# Patient Record
Sex: Male | Born: 1965 | Race: White | Hispanic: No | Marital: Single | Smoking: Current every day smoker
Health system: Southern US, Community
[De-identification: ages and names within clinical notes are randomized; demographics above are authoritative.]

## PROBLEM LIST (undated history)

## (undated) DIAGNOSIS — N170 Acute kidney failure with tubular necrosis: Secondary | ICD-10-CM

## (undated) DIAGNOSIS — G319 Degenerative disease of nervous system, unspecified: Secondary | ICD-10-CM

## (undated) DIAGNOSIS — M6282 Rhabdomyolysis: Secondary | ICD-10-CM

## (undated) DIAGNOSIS — J449 Chronic obstructive pulmonary disease, unspecified: Secondary | ICD-10-CM

## (undated) DIAGNOSIS — E785 Hyperlipidemia, unspecified: Secondary | ICD-10-CM

## (undated) DIAGNOSIS — J189 Pneumonia, unspecified organism: Secondary | ICD-10-CM

## (undated) DIAGNOSIS — I639 Cerebral infarction, unspecified: Secondary | ICD-10-CM

## (undated) DIAGNOSIS — J690 Pneumonitis due to inhalation of food and vomit: Secondary | ICD-10-CM

## (undated) DIAGNOSIS — A419 Sepsis, unspecified organism: Secondary | ICD-10-CM

## (undated) DIAGNOSIS — J9621 Acute and chronic respiratory failure with hypoxia: Secondary | ICD-10-CM

## (undated) DIAGNOSIS — R6520 Severe sepsis without septic shock: Secondary | ICD-10-CM

## (undated) DIAGNOSIS — F1910 Other psychoactive substance abuse, uncomplicated: Secondary | ICD-10-CM

## (undated) DIAGNOSIS — R569 Unspecified convulsions: Secondary | ICD-10-CM

## (undated) DIAGNOSIS — J45909 Unspecified asthma, uncomplicated: Secondary | ICD-10-CM

## (undated) HISTORY — DX: Unspecified asthma, uncomplicated: J45.909

## (undated) HISTORY — DX: Chronic obstructive pulmonary disease, unspecified: J44.9

## (undated) HISTORY — PX: CERVICAL FUSION: SHX112

## (undated) HISTORY — DX: Hyperlipidemia, unspecified: E78.5

## (undated) HISTORY — DX: Unspecified convulsions: R56.9

## (undated) HISTORY — DX: Cerebral infarction, unspecified: I63.9

## (undated) HISTORY — DX: Degenerative disease of nervous system, unspecified: G31.9

---

## 2013-05-08 HISTORY — PX: CERVICAL FUSION: SHX112

## 2014-03-02 DIAGNOSIS — D62 Acute posthemorrhagic anemia: Secondary | ICD-10-CM | POA: Insufficient documentation

## 2014-03-02 DIAGNOSIS — D696 Thrombocytopenia, unspecified: Secondary | ICD-10-CM | POA: Insufficient documentation

## 2014-03-02 DIAGNOSIS — J939 Pneumothorax, unspecified: Secondary | ICD-10-CM | POA: Insufficient documentation

## 2014-03-02 DIAGNOSIS — S129XXA Fracture of neck, unspecified, initial encounter: Secondary | ICD-10-CM | POA: Insufficient documentation

## 2014-03-02 DIAGNOSIS — E871 Hypo-osmolality and hyponatremia: Secondary | ICD-10-CM | POA: Insufficient documentation

## 2014-03-02 DIAGNOSIS — F10929 Alcohol use, unspecified with intoxication, unspecified: Secondary | ICD-10-CM | POA: Insufficient documentation

## 2014-03-02 DIAGNOSIS — F17200 Nicotine dependence, unspecified, uncomplicated: Secondary | ICD-10-CM | POA: Insufficient documentation

## 2014-03-02 DIAGNOSIS — S13171A Dislocation of C6/C7 cervical vertebrae, initial encounter: Secondary | ICD-10-CM | POA: Insufficient documentation

## 2014-03-02 DIAGNOSIS — D72829 Elevated white blood cell count, unspecified: Secondary | ICD-10-CM | POA: Insufficient documentation

## 2014-03-20 DIAGNOSIS — M792 Neuralgia and neuritis, unspecified: Secondary | ICD-10-CM | POA: Insufficient documentation

## 2014-04-24 DIAGNOSIS — Z981 Arthrodesis status: Secondary | ICD-10-CM | POA: Insufficient documentation

## 2016-12-11 DIAGNOSIS — S92015A Nondisplaced fracture of body of left calcaneus, initial encounter for closed fracture: Secondary | ICD-10-CM | POA: Diagnosis not present

## 2016-12-25 DIAGNOSIS — S92015A Nondisplaced fracture of body of left calcaneus, initial encounter for closed fracture: Secondary | ICD-10-CM | POA: Diagnosis not present

## 2017-01-15 DIAGNOSIS — S92015D Nondisplaced fracture of body of left calcaneus, subsequent encounter for fracture with routine healing: Secondary | ICD-10-CM | POA: Diagnosis not present

## 2017-02-26 DIAGNOSIS — S92015D Nondisplaced fracture of body of left calcaneus, subsequent encounter for fracture with routine healing: Secondary | ICD-10-CM | POA: Diagnosis not present

## 2017-03-26 DIAGNOSIS — S92015D Nondisplaced fracture of body of left calcaneus, subsequent encounter for fracture with routine healing: Secondary | ICD-10-CM | POA: Diagnosis not present

## 2017-05-08 DIAGNOSIS — I639 Cerebral infarction, unspecified: Secondary | ICD-10-CM

## 2017-05-08 HISTORY — DX: Cerebral infarction, unspecified: I63.9

## 2018-07-07 DIAGNOSIS — R918 Other nonspecific abnormal finding of lung field: Secondary | ICD-10-CM | POA: Diagnosis not present

## 2018-07-07 DIAGNOSIS — R4020 Unspecified coma: Secondary | ICD-10-CM | POA: Diagnosis not present

## 2018-07-07 DIAGNOSIS — R0902 Hypoxemia: Secondary | ICD-10-CM | POA: Diagnosis not present

## 2018-07-07 DIAGNOSIS — R911 Solitary pulmonary nodule: Secondary | ICD-10-CM | POA: Diagnosis not present

## 2018-07-07 DIAGNOSIS — R404 Transient alteration of awareness: Secondary | ICD-10-CM | POA: Diagnosis not present

## 2018-07-07 DIAGNOSIS — R079 Chest pain, unspecified: Secondary | ICD-10-CM | POA: Diagnosis not present

## 2018-07-07 DIAGNOSIS — R0600 Dyspnea, unspecified: Secondary | ICD-10-CM | POA: Diagnosis not present

## 2018-07-07 DIAGNOSIS — T68XXXA Hypothermia, initial encounter: Secondary | ICD-10-CM

## 2018-07-07 DIAGNOSIS — A419 Sepsis, unspecified organism: Secondary | ICD-10-CM

## 2018-07-07 DIAGNOSIS — R6521 Severe sepsis with septic shock: Secondary | ICD-10-CM | POA: Diagnosis not present

## 2018-07-07 DIAGNOSIS — R4182 Altered mental status, unspecified: Secondary | ICD-10-CM

## 2018-07-07 DIAGNOSIS — J189 Pneumonia, unspecified organism: Secondary | ICD-10-CM

## 2018-07-07 DIAGNOSIS — K760 Fatty (change of) liver, not elsewhere classified: Secondary | ICD-10-CM | POA: Diagnosis not present

## 2018-07-07 DIAGNOSIS — N179 Acute kidney failure, unspecified: Secondary | ICD-10-CM

## 2018-07-07 DIAGNOSIS — Z452 Encounter for adjustment and management of vascular access device: Secondary | ICD-10-CM | POA: Diagnosis not present

## 2018-07-07 DIAGNOSIS — F1010 Alcohol abuse, uncomplicated: Secondary | ICD-10-CM

## 2018-07-07 DIAGNOSIS — R0689 Other abnormalities of breathing: Secondary | ICD-10-CM | POA: Diagnosis not present

## 2018-07-07 DIAGNOSIS — M6282 Rhabdomyolysis: Secondary | ICD-10-CM

## 2018-07-08 DIAGNOSIS — N19 Unspecified kidney failure: Secondary | ICD-10-CM | POA: Diagnosis not present

## 2018-07-08 DIAGNOSIS — J9600 Acute respiratory failure, unspecified whether with hypoxia or hypercapnia: Secondary | ICD-10-CM | POA: Diagnosis not present

## 2018-07-08 DIAGNOSIS — T68XXXA Hypothermia, initial encounter: Secondary | ICD-10-CM | POA: Diagnosis not present

## 2018-07-08 DIAGNOSIS — R4182 Altered mental status, unspecified: Secondary | ICD-10-CM | POA: Diagnosis not present

## 2018-07-08 DIAGNOSIS — J189 Pneumonia, unspecified organism: Secondary | ICD-10-CM | POA: Diagnosis not present

## 2018-07-08 DIAGNOSIS — N179 Acute kidney failure, unspecified: Secondary | ICD-10-CM | POA: Diagnosis not present

## 2018-07-08 DIAGNOSIS — R935 Abnormal findings on diagnostic imaging of other abdominal regions, including retroperitoneum: Secondary | ICD-10-CM | POA: Diagnosis not present

## 2018-07-09 DIAGNOSIS — R4182 Altered mental status, unspecified: Secondary | ICD-10-CM | POA: Diagnosis not present

## 2018-07-09 DIAGNOSIS — T68XXXA Hypothermia, initial encounter: Secondary | ICD-10-CM | POA: Diagnosis not present

## 2018-07-09 DIAGNOSIS — N179 Acute kidney failure, unspecified: Secondary | ICD-10-CM | POA: Diagnosis not present

## 2018-07-09 DIAGNOSIS — K7031 Alcoholic cirrhosis of liver with ascites: Secondary | ICD-10-CM | POA: Diagnosis not present

## 2018-07-09 DIAGNOSIS — J189 Pneumonia, unspecified organism: Secondary | ICD-10-CM | POA: Diagnosis not present

## 2018-07-09 DIAGNOSIS — J9690 Respiratory failure, unspecified, unspecified whether with hypoxia or hypercapnia: Secondary | ICD-10-CM | POA: Diagnosis not present

## 2018-07-10 DIAGNOSIS — T68XXXA Hypothermia, initial encounter: Secondary | ICD-10-CM | POA: Diagnosis not present

## 2018-07-10 DIAGNOSIS — N179 Acute kidney failure, unspecified: Secondary | ICD-10-CM | POA: Diagnosis not present

## 2018-07-10 DIAGNOSIS — J189 Pneumonia, unspecified organism: Secondary | ICD-10-CM | POA: Diagnosis not present

## 2018-07-10 DIAGNOSIS — R4182 Altered mental status, unspecified: Secondary | ICD-10-CM | POA: Diagnosis not present

## 2018-07-10 DIAGNOSIS — J9690 Respiratory failure, unspecified, unspecified whether with hypoxia or hypercapnia: Secondary | ICD-10-CM | POA: Diagnosis not present

## 2018-07-11 DIAGNOSIS — R4182 Altered mental status, unspecified: Secondary | ICD-10-CM | POA: Diagnosis not present

## 2018-07-11 DIAGNOSIS — J189 Pneumonia, unspecified organism: Secondary | ICD-10-CM | POA: Diagnosis not present

## 2018-07-11 DIAGNOSIS — N179 Acute kidney failure, unspecified: Secondary | ICD-10-CM | POA: Diagnosis not present

## 2018-07-11 DIAGNOSIS — T68XXXA Hypothermia, initial encounter: Secondary | ICD-10-CM | POA: Diagnosis not present

## 2018-07-12 DIAGNOSIS — T68XXXA Hypothermia, initial encounter: Secondary | ICD-10-CM | POA: Diagnosis not present

## 2018-07-12 DIAGNOSIS — J9690 Respiratory failure, unspecified, unspecified whether with hypoxia or hypercapnia: Secondary | ICD-10-CM | POA: Diagnosis not present

## 2018-07-12 DIAGNOSIS — J189 Pneumonia, unspecified organism: Secondary | ICD-10-CM | POA: Diagnosis not present

## 2018-07-12 DIAGNOSIS — N179 Acute kidney failure, unspecified: Secondary | ICD-10-CM | POA: Diagnosis not present

## 2018-07-12 DIAGNOSIS — R4182 Altered mental status, unspecified: Secondary | ICD-10-CM | POA: Diagnosis not present

## 2018-07-13 DIAGNOSIS — J9690 Respiratory failure, unspecified, unspecified whether with hypoxia or hypercapnia: Secondary | ICD-10-CM | POA: Diagnosis not present

## 2018-07-13 DIAGNOSIS — R4182 Altered mental status, unspecified: Secondary | ICD-10-CM | POA: Diagnosis not present

## 2018-07-13 DIAGNOSIS — R443 Hallucinations, unspecified: Secondary | ICD-10-CM | POA: Diagnosis not present

## 2018-07-13 DIAGNOSIS — T68XXXA Hypothermia, initial encounter: Secondary | ICD-10-CM | POA: Diagnosis not present

## 2018-07-13 DIAGNOSIS — J189 Pneumonia, unspecified organism: Secondary | ICD-10-CM | POA: Diagnosis not present

## 2018-07-13 DIAGNOSIS — N179 Acute kidney failure, unspecified: Secondary | ICD-10-CM | POA: Diagnosis not present

## 2018-07-14 DIAGNOSIS — T68XXXA Hypothermia, initial encounter: Secondary | ICD-10-CM | POA: Diagnosis not present

## 2018-07-14 DIAGNOSIS — N179 Acute kidney failure, unspecified: Secondary | ICD-10-CM | POA: Diagnosis not present

## 2018-07-14 DIAGNOSIS — J984 Other disorders of lung: Secondary | ICD-10-CM | POA: Diagnosis not present

## 2018-07-14 DIAGNOSIS — J189 Pneumonia, unspecified organism: Secondary | ICD-10-CM | POA: Diagnosis not present

## 2018-07-14 DIAGNOSIS — R4182 Altered mental status, unspecified: Secondary | ICD-10-CM | POA: Diagnosis not present

## 2018-07-15 DIAGNOSIS — R4182 Altered mental status, unspecified: Secondary | ICD-10-CM | POA: Diagnosis not present

## 2018-07-15 DIAGNOSIS — A4150 Gram-negative sepsis, unspecified: Secondary | ICD-10-CM | POA: Diagnosis not present

## 2018-07-15 DIAGNOSIS — J189 Pneumonia, unspecified organism: Secondary | ICD-10-CM | POA: Diagnosis not present

## 2018-07-15 DIAGNOSIS — J90 Pleural effusion, not elsewhere classified: Secondary | ICD-10-CM | POA: Diagnosis not present

## 2018-07-15 DIAGNOSIS — T68XXXA Hypothermia, initial encounter: Secondary | ICD-10-CM | POA: Diagnosis not present

## 2018-07-15 DIAGNOSIS — N179 Acute kidney failure, unspecified: Secondary | ICD-10-CM | POA: Diagnosis not present

## 2018-07-16 ENCOUNTER — Inpatient Hospital Stay
Admission: RE | Admit: 2018-07-16 | Discharge: 2018-08-20 | Disposition: A | Discharge status: Skilled Nursing Facility | Payer: Medicare Other | Source: Other Acute Inpatient Hospital | Attending: Internal Medicine | Admitting: Internal Medicine

## 2018-07-16 DIAGNOSIS — A419 Sepsis, unspecified organism: Secondary | ICD-10-CM | POA: Diagnosis present

## 2018-07-16 DIAGNOSIS — R0902 Hypoxemia: Secondary | ICD-10-CM

## 2018-07-16 DIAGNOSIS — M6282 Rhabdomyolysis: Secondary | ICD-10-CM | POA: Diagnosis present

## 2018-07-16 DIAGNOSIS — N170 Acute kidney failure with tubular necrosis: Secondary | ICD-10-CM | POA: Diagnosis present

## 2018-07-16 DIAGNOSIS — R6520 Severe sepsis without septic shock: Secondary | ICD-10-CM

## 2018-07-16 DIAGNOSIS — J189 Pneumonia, unspecified organism: Secondary | ICD-10-CM | POA: Diagnosis not present

## 2018-07-16 DIAGNOSIS — J9621 Acute and chronic respiratory failure with hypoxia: Secondary | ICD-10-CM | POA: Diagnosis present

## 2018-07-16 DIAGNOSIS — J90 Pleural effusion, not elsewhere classified: Secondary | ICD-10-CM | POA: Diagnosis not present

## 2018-07-16 DIAGNOSIS — R5381 Other malaise: Secondary | ICD-10-CM | POA: Diagnosis not present

## 2018-07-16 DIAGNOSIS — R4182 Altered mental status, unspecified: Secondary | ICD-10-CM | POA: Diagnosis not present

## 2018-07-16 DIAGNOSIS — J811 Chronic pulmonary edema: Secondary | ICD-10-CM

## 2018-07-16 DIAGNOSIS — J9690 Respiratory failure, unspecified, unspecified whether with hypoxia or hypercapnia: Secondary | ICD-10-CM

## 2018-07-16 DIAGNOSIS — Z4659 Encounter for fitting and adjustment of other gastrointestinal appliance and device: Secondary | ICD-10-CM

## 2018-07-16 DIAGNOSIS — Z743 Need for continuous supervision: Secondary | ICD-10-CM | POA: Diagnosis not present

## 2018-07-16 DIAGNOSIS — N179 Acute kidney failure, unspecified: Secondary | ICD-10-CM | POA: Diagnosis not present

## 2018-07-16 DIAGNOSIS — Z431 Encounter for attention to gastrostomy: Secondary | ICD-10-CM

## 2018-07-16 DIAGNOSIS — Z789 Other specified health status: Secondary | ICD-10-CM

## 2018-07-16 DIAGNOSIS — R0602 Shortness of breath: Secondary | ICD-10-CM

## 2018-07-16 DIAGNOSIS — F1910 Other psychoactive substance abuse, uncomplicated: Secondary | ICD-10-CM | POA: Diagnosis present

## 2018-07-16 DIAGNOSIS — J690 Pneumonitis due to inhalation of food and vomit: Secondary | ICD-10-CM | POA: Diagnosis present

## 2018-07-16 DIAGNOSIS — T68XXXA Hypothermia, initial encounter: Secondary | ICD-10-CM | POA: Diagnosis not present

## 2018-07-16 HISTORY — DX: Acute kidney failure with tubular necrosis: N17.0

## 2018-07-16 HISTORY — DX: Severe sepsis without septic shock: A41.9

## 2018-07-16 HISTORY — DX: Pneumonia, unspecified organism: J18.9

## 2018-07-16 HISTORY — DX: Pneumonitis due to inhalation of food and vomit: J69.0

## 2018-07-16 HISTORY — DX: Severe sepsis without septic shock: R65.20

## 2018-07-16 HISTORY — DX: Acute and chronic respiratory failure with hypoxia: J96.21

## 2018-07-16 HISTORY — DX: Rhabdomyolysis: M62.82

## 2018-07-16 HISTORY — DX: Other psychoactive substance abuse, uncomplicated: F19.10

## 2018-07-16 LAB — PROTIME-INR
INR: 1.3 — ABNORMAL HIGH (ref 0.8–1.2)
Prothrombin Time: 15.6 seconds — ABNORMAL HIGH (ref 11.4–15.2)

## 2018-07-17 ENCOUNTER — Other Ambulatory Visit (HOSPITAL_BASED_OUTPATIENT_CLINIC_OR_DEPARTMENT_OTHER): Payer: Medicare Other

## 2018-07-17 ENCOUNTER — Other Ambulatory Visit (HOSPITAL_COMMUNITY): Payer: Medicare Other

## 2018-07-17 DIAGNOSIS — J9690 Respiratory failure, unspecified, unspecified whether with hypoxia or hypercapnia: Secondary | ICD-10-CM | POA: Diagnosis not present

## 2018-07-17 DIAGNOSIS — J9601 Acute respiratory failure with hypoxia: Secondary | ICD-10-CM | POA: Diagnosis not present

## 2018-07-17 DIAGNOSIS — N179 Acute kidney failure, unspecified: Secondary | ICD-10-CM | POA: Diagnosis not present

## 2018-07-17 DIAGNOSIS — R0602 Shortness of breath: Secondary | ICD-10-CM | POA: Diagnosis not present

## 2018-07-17 DIAGNOSIS — J690 Pneumonitis due to inhalation of food and vomit: Secondary | ICD-10-CM | POA: Diagnosis not present

## 2018-07-17 DIAGNOSIS — R4182 Altered mental status, unspecified: Secondary | ICD-10-CM | POA: Diagnosis not present

## 2018-07-17 LAB — COMPREHENSIVE METABOLIC PANEL
ALT: 28 U/L (ref 0–44)
AST: 30 U/L (ref 15–41)
Albumin: 2.4 g/dL — ABNORMAL LOW (ref 3.5–5.0)
Alkaline Phosphatase: 128 U/L — ABNORMAL HIGH (ref 38–126)
Anion gap: 8 (ref 5–15)
BILIRUBIN TOTAL: 0.9 mg/dL (ref 0.3–1.2)
BUN: 7 mg/dL (ref 6–20)
CO2: 31 mmol/L (ref 22–32)
Calcium: 8.2 mg/dL — ABNORMAL LOW (ref 8.9–10.3)
Chloride: 102 mmol/L (ref 98–111)
Creatinine, Ser: 0.94 mg/dL (ref 0.61–1.24)
GFR calc non Af Amer: >60 mL/min (ref >60)
Glucose, Bld: 103 mg/dL — ABNORMAL HIGH (ref 70–99)
Potassium: 3 mmol/L — ABNORMAL LOW (ref 3.5–5.1)
Sodium: 141 mmol/L (ref 135–145)
TOTAL PROTEIN: 5.3 g/dL — AB (ref 6.5–8.1)

## 2018-07-17 LAB — BLOOD GAS, ARTERIAL
Acid-Base Excess: 8 mmol/L — ABNORMAL HIGH (ref 0.0–2.0)
Bicarbonate: 32.8 mmol/L — ABNORMAL HIGH (ref 20.0–28.0)
DELIVERY SYSTEMS: POSITIVE
Expiratory PAP: 6
FIO2: 40
Inspiratory PAP: 12
O2 Saturation: 98.7 %
Patient temperature: 98.9
pCO2 arterial: 53.6 mmHg — ABNORMAL HIGH (ref 32.0–48.0)
pH, Arterial: 7.404 (ref 7.350–7.450)
pO2, Arterial: 134 mmHg — ABNORMAL HIGH (ref 83.0–108.0)

## 2018-07-17 LAB — MAGNESIUM: Magnesium: 1.4 mg/dL — ABNORMAL LOW (ref 1.7–2.4)

## 2018-07-17 LAB — CBC
HCT: 28.9 % — ABNORMAL LOW (ref 39.0–52.0)
Hemoglobin: 9.3 g/dL — ABNORMAL LOW (ref 13.0–17.0)
MCH: 34.4 pg — ABNORMAL HIGH (ref 26.0–34.0)
MCHC: 32.2 g/dL (ref 30.0–36.0)
MCV: 107 fL — ABNORMAL HIGH (ref 80.0–100.0)
Platelets: 221 10*3/uL (ref 150–400)
RBC: 2.7 MIL/uL — ABNORMAL LOW (ref 4.22–5.81)
RDW: 15.2 % (ref 11.5–15.5)
WBC: 13.5 10*3/uL — ABNORMAL HIGH (ref 4.0–10.5)
nRBC: 0 % (ref 0.0–0.2)

## 2018-07-17 LAB — VANCOMYCIN, TROUGH: Vancomycin Tr: 15 ug/mL (ref 15–20)

## 2018-07-17 LAB — CK TOTAL AND CKMB (NOT AT ARMC)
CK, MB: 8.8 ng/mL — ABNORMAL HIGH (ref 0.5–5.0)
Relative Index: INVALID (ref 0.0–2.5)
Total CK: 80 U/L (ref 49–397)

## 2018-07-17 NOTE — Progress Notes (Signed)
  Echocardiogram 2D Echocardiogram has been performed.  Celene Skeen 07/17/2018, 2:54 PM

## 2018-07-18 DIAGNOSIS — J690 Pneumonitis due to inhalation of food and vomit: Secondary | ICD-10-CM | POA: Diagnosis not present

## 2018-07-18 DIAGNOSIS — R4182 Altered mental status, unspecified: Secondary | ICD-10-CM | POA: Diagnosis not present

## 2018-07-18 DIAGNOSIS — J9601 Acute respiratory failure with hypoxia: Secondary | ICD-10-CM | POA: Diagnosis not present

## 2018-07-18 DIAGNOSIS — N179 Acute kidney failure, unspecified: Secondary | ICD-10-CM | POA: Diagnosis not present

## 2018-07-18 LAB — COMPREHENSIVE METABOLIC PANEL
ALT: 23 U/L (ref 0–44)
ANION GAP: 8 (ref 5–15)
AST: 30 U/L (ref 15–41)
Albumin: 2 g/dL — ABNORMAL LOW (ref 3.5–5.0)
Alkaline Phosphatase: 106 U/L (ref 38–126)
BILIRUBIN TOTAL: 0.5 mg/dL (ref 0.3–1.2)
BUN: 11 mg/dL (ref 6–20)
CO2: 30 mmol/L (ref 22–32)
Calcium: 7.9 mg/dL — ABNORMAL LOW (ref 8.9–10.3)
Chloride: 102 mmol/L (ref 98–111)
Creatinine, Ser: 0.86 mg/dL (ref 0.61–1.24)
GFR calc Af Amer: >60 mL/min (ref >60)
GFR calc non Af Amer: >60 mL/min (ref >60)
Glucose, Bld: 144 mg/dL — ABNORMAL HIGH (ref 70–99)
Potassium: 2.6 mmol/L — CL (ref 3.5–5.1)
Sodium: 140 mmol/L (ref 135–145)
Total Protein: 5 g/dL — ABNORMAL LOW (ref 6.5–8.1)

## 2018-07-18 LAB — TRIGLYCERIDES: Triglycerides: 93 mg/dL (ref <150)

## 2018-07-18 LAB — TSH: TSH: 9.677 u[IU]/mL — ABNORMAL HIGH (ref 0.350–4.500)

## 2018-07-18 LAB — PHOSPHORUS: Phosphorus: 3.1 mg/dL (ref 2.5–4.6)

## 2018-07-18 LAB — VANCOMYCIN, TROUGH: Vancomycin Tr: 18 ug/mL (ref 15–20)

## 2018-07-18 LAB — MAGNESIUM: Magnesium: 1.5 mg/dL — ABNORMAL LOW (ref 1.7–2.4)

## 2018-07-18 LAB — T4, FREE: Free T4: 0.77 ng/dL — ABNORMAL LOW (ref 0.82–1.77)

## 2018-07-19 ENCOUNTER — Other Ambulatory Visit (HOSPITAL_COMMUNITY): Payer: Medicare Other

## 2018-07-19 DIAGNOSIS — J690 Pneumonitis due to inhalation of food and vomit: Secondary | ICD-10-CM | POA: Diagnosis not present

## 2018-07-19 DIAGNOSIS — J9601 Acute respiratory failure with hypoxia: Secondary | ICD-10-CM | POA: Diagnosis not present

## 2018-07-19 DIAGNOSIS — I509 Heart failure, unspecified: Secondary | ICD-10-CM | POA: Diagnosis not present

## 2018-07-19 DIAGNOSIS — E039 Hypothyroidism, unspecified: Secondary | ICD-10-CM | POA: Diagnosis not present

## 2018-07-19 DIAGNOSIS — R4182 Altered mental status, unspecified: Secondary | ICD-10-CM | POA: Diagnosis not present

## 2018-07-19 LAB — BASIC METABOLIC PANEL
Anion gap: 8 (ref 5–15)
BUN: 14 mg/dL (ref 6–20)
CO2: 27 mmol/L (ref 22–32)
Calcium: 7.9 mg/dL — ABNORMAL LOW (ref 8.9–10.3)
Chloride: 102 mmol/L (ref 98–111)
Creatinine, Ser: 0.71 mg/dL (ref 0.61–1.24)
GFR calc Af Amer: >60 mL/min (ref >60)
GFR calc non Af Amer: >60 mL/min (ref >60)
Glucose, Bld: 116 mg/dL — ABNORMAL HIGH (ref 70–99)
Potassium: 3.3 mmol/L — ABNORMAL LOW (ref 3.5–5.1)
Sodium: 137 mmol/L (ref 135–145)

## 2018-07-19 LAB — VANCOMYCIN, TROUGH
Vancomycin Tr: 25 ug/mL (ref 15–20)
Vancomycin Tr: 12 ug/mL — ABNORMAL LOW (ref 15–20)

## 2018-07-20 DIAGNOSIS — R4182 Altered mental status, unspecified: Secondary | ICD-10-CM | POA: Diagnosis not present

## 2018-07-20 DIAGNOSIS — E039 Hypothyroidism, unspecified: Secondary | ICD-10-CM | POA: Diagnosis not present

## 2018-07-20 DIAGNOSIS — J9601 Acute respiratory failure with hypoxia: Secondary | ICD-10-CM | POA: Diagnosis not present

## 2018-07-20 DIAGNOSIS — J690 Pneumonitis due to inhalation of food and vomit: Secondary | ICD-10-CM | POA: Diagnosis not present

## 2018-07-20 LAB — VANCOMYCIN, TROUGH: Vancomycin Tr: 22 ug/mL (ref 15–20)

## 2018-07-21 DIAGNOSIS — R4182 Altered mental status, unspecified: Secondary | ICD-10-CM | POA: Diagnosis not present

## 2018-07-21 DIAGNOSIS — E039 Hypothyroidism, unspecified: Secondary | ICD-10-CM | POA: Diagnosis not present

## 2018-07-21 DIAGNOSIS — J9601 Acute respiratory failure with hypoxia: Secondary | ICD-10-CM | POA: Diagnosis not present

## 2018-07-21 DIAGNOSIS — J690 Pneumonitis due to inhalation of food and vomit: Secondary | ICD-10-CM | POA: Diagnosis not present

## 2018-07-21 LAB — PHOSPHORUS: Phosphorus: 4.7 mg/dL — ABNORMAL HIGH (ref 2.5–4.6)

## 2018-07-21 LAB — BASIC METABOLIC PANEL
Anion gap: 10 (ref 5–15)
BUN: 16 mg/dL (ref 6–20)
CO2: 26 mmol/L (ref 22–32)
Calcium: 8.5 mg/dL — ABNORMAL LOW (ref 8.9–10.3)
Chloride: 99 mmol/L (ref 98–111)
Creatinine, Ser: 0.61 mg/dL (ref 0.61–1.24)
GFR calc non Af Amer: >60 mL/min (ref >60)
Glucose, Bld: 110 mg/dL — ABNORMAL HIGH (ref 70–99)
Potassium: 3.8 mmol/L (ref 3.5–5.1)
Sodium: 135 mmol/L (ref 135–145)

## 2018-07-21 LAB — MAGNESIUM: Magnesium: 1.6 mg/dL — ABNORMAL LOW (ref 1.7–2.4)

## 2018-07-21 LAB — VANCOMYCIN, TROUGH: Vancomycin Tr: 23 ug/mL (ref 15–20)

## 2018-07-21 LAB — TRIGLYCERIDES: Triglycerides: 99 mg/dL (ref <150)

## 2018-07-22 DIAGNOSIS — J9600 Acute respiratory failure, unspecified whether with hypoxia or hypercapnia: Secondary | ICD-10-CM | POA: Diagnosis not present

## 2018-07-22 DIAGNOSIS — E039 Hypothyroidism, unspecified: Secondary | ICD-10-CM | POA: Diagnosis not present

## 2018-07-22 DIAGNOSIS — J690 Pneumonitis due to inhalation of food and vomit: Secondary | ICD-10-CM | POA: Diagnosis not present

## 2018-07-22 DIAGNOSIS — R4182 Altered mental status, unspecified: Secondary | ICD-10-CM | POA: Diagnosis not present

## 2018-07-22 LAB — COMPREHENSIVE METABOLIC PANEL
ALT: 19 U/L (ref 0–44)
ANION GAP: 9 (ref 5–15)
AST: 36 U/L (ref 15–41)
Albumin: 2.1 g/dL — ABNORMAL LOW (ref 3.5–5.0)
Alkaline Phosphatase: 101 U/L (ref 38–126)
BUN: 15 mg/dL (ref 6–20)
CO2: 25 mmol/L (ref 22–32)
Calcium: 8.6 mg/dL — ABNORMAL LOW (ref 8.9–10.3)
Chloride: 99 mmol/L (ref 98–111)
Creatinine, Ser: 0.65 mg/dL (ref 0.61–1.24)
GFR calc Af Amer: >60 mL/min (ref >60)
GFR calc non Af Amer: >60 mL/min (ref >60)
Glucose, Bld: 44 mg/dL — CL (ref 70–99)
Potassium: 4.9 mmol/L (ref 3.5–5.1)
Sodium: 133 mmol/L — ABNORMAL LOW (ref 135–145)
Total Bilirubin: 0.7 mg/dL (ref 0.3–1.2)
Total Protein: 5.7 g/dL — ABNORMAL LOW (ref 6.5–8.1)

## 2018-07-22 LAB — VANCOMYCIN, TROUGH: Vancomycin Tr: 8 ug/mL — ABNORMAL LOW (ref 15–20)

## 2018-07-23 DIAGNOSIS — E162 Hypoglycemia, unspecified: Secondary | ICD-10-CM | POA: Diagnosis not present

## 2018-07-23 DIAGNOSIS — J9600 Acute respiratory failure, unspecified whether with hypoxia or hypercapnia: Secondary | ICD-10-CM | POA: Diagnosis not present

## 2018-07-23 DIAGNOSIS — E039 Hypothyroidism, unspecified: Secondary | ICD-10-CM | POA: Diagnosis not present

## 2018-07-23 DIAGNOSIS — J690 Pneumonitis due to inhalation of food and vomit: Secondary | ICD-10-CM | POA: Diagnosis not present

## 2018-07-23 LAB — VANCOMYCIN, TROUGH: Vancomycin Tr: 16 ug/mL (ref 15–20)

## 2018-07-24 DIAGNOSIS — J9600 Acute respiratory failure, unspecified whether with hypoxia or hypercapnia: Secondary | ICD-10-CM | POA: Diagnosis not present

## 2018-07-24 DIAGNOSIS — J690 Pneumonitis due to inhalation of food and vomit: Secondary | ICD-10-CM | POA: Diagnosis not present

## 2018-07-24 DIAGNOSIS — E162 Hypoglycemia, unspecified: Secondary | ICD-10-CM | POA: Diagnosis not present

## 2018-07-24 DIAGNOSIS — E039 Hypothyroidism, unspecified: Secondary | ICD-10-CM | POA: Diagnosis not present

## 2018-07-24 LAB — TRIGLYCERIDES: Triglycerides: 69 mg/dL (ref <150)

## 2018-07-24 LAB — PHOSPHORUS: Phosphorus: 5.3 mg/dL — ABNORMAL HIGH (ref 2.5–4.6)

## 2018-07-24 LAB — VANCOMYCIN, TROUGH: Vancomycin Tr: 7 ug/mL — ABNORMAL LOW (ref 15–20)

## 2018-07-24 LAB — MAGNESIUM: Magnesium: 1.8 mg/dL (ref 1.7–2.4)

## 2018-07-25 DIAGNOSIS — J690 Pneumonitis due to inhalation of food and vomit: Secondary | ICD-10-CM | POA: Diagnosis not present

## 2018-07-25 DIAGNOSIS — E039 Hypothyroidism, unspecified: Secondary | ICD-10-CM | POA: Diagnosis not present

## 2018-07-25 DIAGNOSIS — E162 Hypoglycemia, unspecified: Secondary | ICD-10-CM | POA: Diagnosis not present

## 2018-07-25 DIAGNOSIS — J9600 Acute respiratory failure, unspecified whether with hypoxia or hypercapnia: Secondary | ICD-10-CM | POA: Diagnosis not present

## 2018-07-25 LAB — VANCOMYCIN, TROUGH

## 2018-07-26 DIAGNOSIS — R4182 Altered mental status, unspecified: Secondary | ICD-10-CM | POA: Diagnosis not present

## 2018-07-26 DIAGNOSIS — E039 Hypothyroidism, unspecified: Secondary | ICD-10-CM | POA: Diagnosis not present

## 2018-07-26 DIAGNOSIS — J9600 Acute respiratory failure, unspecified whether with hypoxia or hypercapnia: Secondary | ICD-10-CM | POA: Diagnosis not present

## 2018-07-26 DIAGNOSIS — J690 Pneumonitis due to inhalation of food and vomit: Secondary | ICD-10-CM | POA: Diagnosis not present

## 2018-07-26 LAB — VANCOMYCIN, TROUGH: Vancomycin Tr: <4 ug/mL — ABNORMAL LOW (ref 15–20)

## 2018-07-27 ENCOUNTER — Other Ambulatory Visit (HOSPITAL_COMMUNITY): Payer: Medicare Other

## 2018-07-27 DIAGNOSIS — J690 Pneumonitis due to inhalation of food and vomit: Secondary | ICD-10-CM | POA: Diagnosis not present

## 2018-07-27 DIAGNOSIS — R4182 Altered mental status, unspecified: Secondary | ICD-10-CM | POA: Diagnosis not present

## 2018-07-27 DIAGNOSIS — J9600 Acute respiratory failure, unspecified whether with hypoxia or hypercapnia: Secondary | ICD-10-CM | POA: Diagnosis not present

## 2018-07-27 DIAGNOSIS — E039 Hypothyroidism, unspecified: Secondary | ICD-10-CM | POA: Diagnosis not present

## 2018-07-27 DIAGNOSIS — R0602 Shortness of breath: Secondary | ICD-10-CM | POA: Diagnosis not present

## 2018-07-27 LAB — COMPREHENSIVE METABOLIC PANEL
ALT: 19 U/L (ref 0–44)
AST: 30 U/L (ref 15–41)
Albumin: 2.4 g/dL — ABNORMAL LOW (ref 3.5–5.0)
Alkaline Phosphatase: 99 U/L (ref 38–126)
Anion gap: 9 (ref 5–15)
BUN: 13 mg/dL (ref 6–20)
CO2: 25 mmol/L (ref 22–32)
Calcium: 9 mg/dL (ref 8.9–10.3)
Chloride: 95 mmol/L — ABNORMAL LOW (ref 98–111)
Creatinine, Ser: 0.5 mg/dL — ABNORMAL LOW (ref 0.61–1.24)
GFR calc Af Amer: >60 mL/min (ref >60)
GFR calc non Af Amer: >60 mL/min (ref >60)
GLUCOSE: 103 mg/dL — AB (ref 70–99)
POTASSIUM: 3.7 mmol/L (ref 3.5–5.1)
Sodium: 129 mmol/L — ABNORMAL LOW (ref 135–145)
TOTAL PROTEIN: 6 g/dL — AB (ref 6.5–8.1)
Total Bilirubin: 0.3 mg/dL (ref 0.3–1.2)

## 2018-07-27 LAB — CBC
HEMATOCRIT: 26.1 % — AB (ref 39.0–52.0)
Hemoglobin: 8.8 g/dL — ABNORMAL LOW (ref 13.0–17.0)
MCH: 34.2 pg — ABNORMAL HIGH (ref 26.0–34.0)
MCHC: 33.7 g/dL (ref 30.0–36.0)
MCV: 101.6 fL — ABNORMAL HIGH (ref 80.0–100.0)
Platelets: 179 10*3/uL (ref 150–400)
RBC: 2.57 MIL/uL — ABNORMAL LOW (ref 4.22–5.81)
RDW: 13 % (ref 11.5–15.5)
WBC: 4.8 10*3/uL (ref 4.0–10.5)
nRBC: 0 % (ref 0.0–0.2)

## 2018-07-27 LAB — PHOSPHORUS: Phosphorus: 5.9 mg/dL — ABNORMAL HIGH (ref 2.5–4.6)

## 2018-07-27 LAB — VANCOMYCIN, TROUGH: Vancomycin Tr: <4 ug/mL — ABNORMAL LOW (ref 15–20)

## 2018-07-27 LAB — TRIGLYCERIDES: Triglycerides: 144 mg/dL (ref <150)

## 2018-07-27 LAB — MAGNESIUM: Magnesium: 1.6 mg/dL — ABNORMAL LOW (ref 1.7–2.4)

## 2018-07-28 ENCOUNTER — Other Ambulatory Visit (HOSPITAL_COMMUNITY): Payer: Medicare Other

## 2018-07-28 DIAGNOSIS — Z452 Encounter for adjustment and management of vascular access device: Secondary | ICD-10-CM | POA: Diagnosis not present

## 2018-07-28 DIAGNOSIS — R4182 Altered mental status, unspecified: Secondary | ICD-10-CM | POA: Diagnosis not present

## 2018-07-28 DIAGNOSIS — J690 Pneumonitis due to inhalation of food and vomit: Secondary | ICD-10-CM | POA: Diagnosis not present

## 2018-07-28 DIAGNOSIS — J9600 Acute respiratory failure, unspecified whether with hypoxia or hypercapnia: Secondary | ICD-10-CM | POA: Diagnosis not present

## 2018-07-28 DIAGNOSIS — E039 Hypothyroidism, unspecified: Secondary | ICD-10-CM | POA: Diagnosis not present

## 2018-07-29 DIAGNOSIS — J9600 Acute respiratory failure, unspecified whether with hypoxia or hypercapnia: Secondary | ICD-10-CM | POA: Diagnosis not present

## 2018-07-29 DIAGNOSIS — J690 Pneumonitis due to inhalation of food and vomit: Secondary | ICD-10-CM | POA: Diagnosis not present

## 2018-07-29 DIAGNOSIS — R4182 Altered mental status, unspecified: Secondary | ICD-10-CM | POA: Diagnosis not present

## 2018-07-29 DIAGNOSIS — E871 Hypo-osmolality and hyponatremia: Secondary | ICD-10-CM | POA: Diagnosis not present

## 2018-07-29 LAB — CBC
HCT: 27.3 % — ABNORMAL LOW (ref 39.0–52.0)
Hemoglobin: 9.1 g/dL — ABNORMAL LOW (ref 13.0–17.0)
MCH: 33.2 pg (ref 26.0–34.0)
MCHC: 33.3 g/dL (ref 30.0–36.0)
MCV: 99.6 fL (ref 80.0–100.0)
NRBC: 0 % (ref 0.0–0.2)
Platelets: 132 10*3/uL — ABNORMAL LOW (ref 150–400)
RBC: 2.74 MIL/uL — ABNORMAL LOW (ref 4.22–5.81)
RDW: 12.8 % (ref 11.5–15.5)
WBC: 4 10*3/uL (ref 4.0–10.5)

## 2018-07-29 LAB — BASIC METABOLIC PANEL
Anion gap: 9 (ref 5–15)
BUN: 9 mg/dL (ref 6–20)
CO2: 27 mmol/L (ref 22–32)
Calcium: 8.9 mg/dL (ref 8.9–10.3)
Chloride: 88 mmol/L — ABNORMAL LOW (ref 98–111)
Creatinine, Ser: 0.57 mg/dL — ABNORMAL LOW (ref 0.61–1.24)
GFR calc Af Amer: >60 mL/min (ref >60)
Glucose, Bld: 115 mg/dL — ABNORMAL HIGH (ref 70–99)
Potassium: 3.5 mmol/L (ref 3.5–5.1)
Sodium: 124 mmol/L — ABNORMAL LOW (ref 135–145)

## 2018-07-30 DIAGNOSIS — J9600 Acute respiratory failure, unspecified whether with hypoxia or hypercapnia: Secondary | ICD-10-CM | POA: Diagnosis not present

## 2018-07-30 DIAGNOSIS — R4182 Altered mental status, unspecified: Secondary | ICD-10-CM | POA: Diagnosis not present

## 2018-07-30 DIAGNOSIS — J690 Pneumonitis due to inhalation of food and vomit: Secondary | ICD-10-CM | POA: Diagnosis not present

## 2018-07-30 DIAGNOSIS — E039 Hypothyroidism, unspecified: Secondary | ICD-10-CM | POA: Diagnosis not present

## 2018-07-30 LAB — BASIC METABOLIC PANEL
Anion gap: 10 (ref 5–15)
BUN: 12 mg/dL (ref 6–20)
CO2: 28 mmol/L (ref 22–32)
Calcium: 9.6 mg/dL (ref 8.9–10.3)
Chloride: 88 mmol/L — ABNORMAL LOW (ref 98–111)
Creatinine, Ser: 0.52 mg/dL — ABNORMAL LOW (ref 0.61–1.24)
GFR calc Af Amer: >60 mL/min (ref >60)
GFR calc non Af Amer: >60 mL/min (ref >60)
Glucose, Bld: 145 mg/dL — ABNORMAL HIGH (ref 70–99)
POTASSIUM: 4 mmol/L (ref 3.5–5.1)
Sodium: 126 mmol/L — ABNORMAL LOW (ref 135–145)

## 2018-07-30 LAB — MAGNESIUM: Magnesium: 1.7 mg/dL (ref 1.7–2.4)

## 2018-07-30 LAB — PHOSPHORUS: Phosphorus: 4.9 mg/dL — ABNORMAL HIGH (ref 2.5–4.6)

## 2018-07-30 LAB — TRIGLYCERIDES: Triglycerides: 58 mg/dL (ref <150)

## 2018-07-31 ENCOUNTER — Other Ambulatory Visit (HOSPITAL_COMMUNITY): Payer: Medicare Other

## 2018-07-31 DIAGNOSIS — J9600 Acute respiratory failure, unspecified whether with hypoxia or hypercapnia: Secondary | ICD-10-CM | POA: Diagnosis not present

## 2018-07-31 DIAGNOSIS — E039 Hypothyroidism, unspecified: Secondary | ICD-10-CM | POA: Diagnosis not present

## 2018-07-31 DIAGNOSIS — J690 Pneumonitis due to inhalation of food and vomit: Secondary | ICD-10-CM | POA: Diagnosis not present

## 2018-07-31 DIAGNOSIS — Z4682 Encounter for fitting and adjustment of non-vascular catheter: Secondary | ICD-10-CM | POA: Diagnosis not present

## 2018-07-31 DIAGNOSIS — R918 Other nonspecific abnormal finding of lung field: Secondary | ICD-10-CM | POA: Diagnosis not present

## 2018-07-31 DIAGNOSIS — R4182 Altered mental status, unspecified: Secondary | ICD-10-CM | POA: Diagnosis not present

## 2018-07-31 LAB — BLOOD GAS, ARTERIAL
Acid-Base Excess: 7.7 mmol/L — ABNORMAL HIGH (ref 0.0–2.0)
Acid-Base Excess: 5.8 mmol/L — ABNORMAL HIGH (ref 0.0–2.0)
BICARBONATE: 32 mmol/L — AB (ref 20.0–28.0)
BICARBONATE: 30.4 mmol/L — AB (ref 20.0–28.0)
Delivery systems: POSITIVE
Expiratory PAP: 8
FIO2: 65
FIO2: 60
Inspiratory PAP: 14
MECHVT: 400 mL
O2 Saturation: 87 %
O2 Saturation: 99.4 %
PATIENT TEMPERATURE: 98.6
PEEP: 5 cmH2O
Patient temperature: 98.6
RATE: 14 resp/min
pCO2 arterial: 47.8 mmHg (ref 32.0–48.0)
pCO2 arterial: 49 mmHg — ABNORMAL HIGH (ref 32.0–48.0)
pH, Arterial: 7.441 (ref 7.350–7.450)
pH, Arterial: 7.41 (ref 7.350–7.450)
pO2, Arterial: 54.8 mmHg — ABNORMAL LOW (ref 83.0–108.0)
pO2, Arterial: 157 mmHg — ABNORMAL HIGH (ref 83.0–108.0)

## 2018-08-01 ENCOUNTER — Other Ambulatory Visit (HOSPITAL_COMMUNITY): Payer: Medicare Other

## 2018-08-01 ENCOUNTER — Encounter: Payer: Self-pay | Admitting: Internal Medicine

## 2018-08-01 DIAGNOSIS — F1910 Other psychoactive substance abuse, uncomplicated: Secondary | ICD-10-CM

## 2018-08-01 DIAGNOSIS — J189 Pneumonia, unspecified organism: Secondary | ICD-10-CM | POA: Diagnosis present

## 2018-08-01 DIAGNOSIS — R6520 Severe sepsis without septic shock: Secondary | ICD-10-CM

## 2018-08-01 DIAGNOSIS — J9600 Acute respiratory failure, unspecified whether with hypoxia or hypercapnia: Secondary | ICD-10-CM | POA: Diagnosis not present

## 2018-08-01 DIAGNOSIS — R4182 Altered mental status, unspecified: Secondary | ICD-10-CM | POA: Diagnosis not present

## 2018-08-01 DIAGNOSIS — J9621 Acute and chronic respiratory failure with hypoxia: Secondary | ICD-10-CM | POA: Diagnosis present

## 2018-08-01 DIAGNOSIS — J690 Pneumonitis due to inhalation of food and vomit: Secondary | ICD-10-CM

## 2018-08-01 DIAGNOSIS — E039 Hypothyroidism, unspecified: Secondary | ICD-10-CM | POA: Diagnosis not present

## 2018-08-01 DIAGNOSIS — N170 Acute kidney failure with tubular necrosis: Secondary | ICD-10-CM

## 2018-08-01 DIAGNOSIS — M6282 Rhabdomyolysis: Secondary | ICD-10-CM

## 2018-08-01 DIAGNOSIS — A419 Sepsis, unspecified organism: Secondary | ICD-10-CM

## 2018-08-01 LAB — BASIC METABOLIC PANEL
Anion gap: 9 (ref 5–15)
BUN: 20 mg/dL (ref 6–20)
CALCIUM: 9.7 mg/dL (ref 8.9–10.3)
CO2: 30 mmol/L (ref 22–32)
Chloride: 97 mmol/L — ABNORMAL LOW (ref 98–111)
Creatinine, Ser: 0.66 mg/dL (ref 0.61–1.24)
GFR calc non Af Amer: >60 mL/min (ref >60)
Glucose, Bld: 111 mg/dL — ABNORMAL HIGH (ref 70–99)
Potassium: 4.1 mmol/L (ref 3.5–5.1)
Sodium: 136 mmol/L (ref 135–145)

## 2018-08-01 LAB — CBC
HCT: 27 % — ABNORMAL LOW (ref 39.0–52.0)
Hemoglobin: 9 g/dL — ABNORMAL LOW (ref 13.0–17.0)
MCH: 34.2 pg — ABNORMAL HIGH (ref 26.0–34.0)
MCHC: 33.3 g/dL (ref 30.0–36.0)
MCV: 102.7 fL — ABNORMAL HIGH (ref 80.0–100.0)
Platelets: 129 10*3/uL — ABNORMAL LOW (ref 150–400)
RBC: 2.63 MIL/uL — ABNORMAL LOW (ref 4.22–5.81)
RDW: 13.7 % (ref 11.5–15.5)
WBC: 15.3 10*3/uL — ABNORMAL HIGH (ref 4.0–10.5)
nRBC: 0 % (ref 0.0–0.2)

## 2018-08-01 MED ORDER — IOHEXOL 300 MG/ML  SOLN
75.0000 mL | Freq: Once | INTRAMUSCULAR | Status: AC | PRN
  Administered 2018-08-01: 75 mL via INTRAVENOUS

## 2018-08-01 NOTE — Consult Note (Signed)
Pulmonary Critical Care Medicine [facility] [place]  PULMONARY SERVICE  Date of Service: 08/01/2018  PULMONARY CRITICAL CARE CONSULT   Mark Patterson  YOK:[id number]  DOB: Feb 20, 1966   DOA: 07/16/2018  Referring Physician: Carron Curie, MD  HPI: Mark Patterson is a 53 y.o. male seen for follow up of Acute on Chronic Respiratory Failure.  Patient transferred to our facility for further management he has a history of altered mental status at the admitting facility.  Patient was apparently found unresponsive at home it was felt to be related to alcohol or substance use.  Patient at that time was's hypoxic and he was on 60% FiO2.  When he was evaluated also was noted to be significantly hypothermic with a pulse.  Patient had a work-up including a CT scan of the head as well as the remainder of the body and there was some concern about infection and acute pancreatitis and a pulmonary nodule.  Patient was treated as acute sepsis now on Zosyn and vancomycin.  Was also evaluated for cervical injuries found to have no cervical injuries at that time.  Transferred to our facility decompensated and ended up having to be intubated.  Patient is now on mechanical ventilation  Review of Systems:  ROS performed and is unremarkable other than noted above.  Past medical history: Patient's history is unremarkable other than altered mental status Sepsis Acute renal failure Rhabdomyolysis Pulmonary nodule  Past surgical history: Unknown  Social history: Positive for alcohol and drug abuse Positive also for tobacco  Family history: Unknown  Allergies: Reviewed  Medications: Reviewed on Rounds  Physical Exam:  Vitals: Temperature 99.4 pulse 109 respiratory rate 27 blood pressure 130/67 saturations were 98%  Ventilator Settings mode ventilation assist control FiO2 40% tidal volume 450 PEEP 5  . General: Comfortable at this time . Eyes: Grossly normal lids, irises & conjunctiva . ENT:  grossly tongue is normal . Neck: no obvious mass . Cardiovascular: S1-S2 normal no gallop or rub is noted . Respiratory: Coarse breath sounds with a few scattered rhonchi are noted . Abdomen: Soft and nontender . Skin: no rash seen on limited exam . Musculoskeletal: not rigid . Psychiatric:unable to assess . Neurologic: no seizure no involuntary movements         Labs on Admission:  Basic Metabolic Panel: Recent Labs  Lab 07/27/18 0034 07/29/18 0516 07/29/18 2333 07/30/18 0456 08/01/18 0429  NA 129* 124*  --  126* 136  K 3.7 3.5  --  4.0 4.1  CL 95* 88*  --  88* 97*  CO2 25 27  --  28 30  GLUCOSE 103* 115*  --  145* 111*  BUN 13 9  --  12 20  CREATININE 0.50* 0.57*  --  0.52* 0.66  CALCIUM 9.0 8.9  --  9.6 9.7  MG 1.6*  --  1.7  --   --   PHOS 5.9*  --  4.9*  --   --     Recent Labs  Lab 07/27/18 1717 07/31/18 1155 07/31/18 1800  PHART 7.392 7.441 7.410  PCO2ART 47.8 47.8 49.0*  PO2ART 85.4 54.8* 157*  HCO3 28.4* 32.0* 30.4*  O2SAT 96.0 87.0 99.4    Liver Function Tests: Recent Labs  Lab 07/27/18 0034  AST 30  ALT 19  ALKPHOS 99  BILITOT 0.3  PROT 6.0*  ALBUMIN 2.4*   No results for input(s): LIPASE, AMYLASE in the last 168 hours. No results for input(s): AMMONIA in the last 168 hours.  CBC: Recent Labs  Lab 07/27/18 0034 07/29/18 0516 08/01/18 0429  WBC 4.8 4.0 15.3*  HGB 8.8* 9.1* 9.0*  HCT 26.1* 27.3* 27.0*  MCV 101.6* 99.6 102.7*  PLT 179 132* 129*    Cardiac Enzymes: No results for input(s): CKTOTAL, CKMB, CKMBINDEX, TROPONINI in the last 168 hours.  BNP (last 3 results) No results for input(s): BNP in the last 8760 hours.  ProBNP (last 3 results) No results for input(s): PROBNP in the last 8760 hours.   Radiological Exams on Admission: Dg Chest Port 1 View  Result Date: 07/31/2018 CLINICAL DATA:  Intubation EXAM: PORTABLE CHEST 1 VIEW COMPARISON:  07/31/2018 FINDINGS: Endotracheal tube with the tip 3 cm above the carina.  Right-sided PICC line with the tip projecting over the SVC. Right lung is clear. Left lower lobe airspace disease and trace pleural effusion. No pneumothorax. Stable cardiomediastinal silhouette. IMPRESSION: 1. Endotracheal tube with the tip 3 cm above the carina. 2.  Left lower lobe airspace disease and trace pleural effusion. Electronically Signed   By: Elige Ko   On: 07/31/2018 18:27   Dg Chest Port 1 View  Result Date: 07/31/2018 CLINICAL DATA:  Patient nonresponsive.  Oxygen desaturation EXAM: PORTABLE CHEST 1 VIEW COMPARISON:  July 28, 2018 FINDINGS: Airspace consolidation is noted throughout much of the left lower lobe with volume loss. There is a small left pleural effusion. The right lung is mildly hyperexpanded but clear. Heart size and pulmonary vascularity are normal. No adenopathy. Central catheter tip is in the superior vena cava. No pneumothorax. Postoperative changes noted in the lower cervical spine. IMPRESSION: Airspace consolidation with volume loss left lower lobe. Question pneumonia versus aspiration. Small left pleural effusion. Right lung mildly hyperexpanded but clear. Heart size within normal limits. Central catheter tip in superior vena cava. No pneumothorax. Electronically Signed   By: Bretta Bang III M.D.   On: 07/31/2018 13:44   Dg Chest Port 1 View  Result Date: 07/28/2018 CLINICAL DATA:  PICC line placement EXAM: PORTABLE CHEST 1 VIEW COMPARISON:  07/27/2018 FINDINGS: Right PICC line tip overlies the mid SVC. Catheter could be advanced 6.5 cm for tip positioned at the SVC/RA junction, as warranted. Similar appearance of the bibasilar airspace disease. The cardiopericardial silhouette is within normal limits for size. The visualized bony structures of the thorax are intact. Telemetry leads overlie the chest. IMPRESSION: Right PICC line tip overlies the mid SVC level. Electronically Signed   By: Kennith Center M.D.   On: 07/28/2018 19:30    Assessment/Plan Active  Problems:   Acute on chronic respiratory failure with hypoxia (HCC)   Bilateral pneumonia   Non-traumatic rhabdomyolysis   Polysubstance abuse (HCC)   Acute tubular necrosis (HCC)   Severe sepsis (HCC)   1. Acute on chronic respiratory failure with hypoxia patient was intubated overnight because of decompensation with a PO2 of 55.  Post intubation there was significant improvement in the PaO2 we will continue with full vent support at this time and monitor ABGs as needed. 2. Bilateral airspace disease with possible consolidation and small effusion probably present on the left side not enough I think to actually attempt a thoracentesis.  Will review antibiotics with the primary care team 3. Polysubstance abuse will need to monitor closely for any signs of withdrawal. 4. Severe sepsis likely aspiration pneumonia patient was on vancomycin and Zosyn which will be continued. 5. Acute renal failure we will need to monitor his fluid status very closely especially in the setting of rhabdomyolysis.  6. Rhabdomyolysis monitor his renal function and continue with adequate hydration.  I have personally seen and evaluated the patient, evaluated laboratory and imaging results, formulated the assessment and plan and placed orders.  Patient is critically ill in danger of cardiac arrest and death orally intubated with a high risk airway The Patient requires high complexity decision making for assessment and support.  Case was discussed on Rounds with the Respiratory Therapy Staff Time Spent  Yevonne Pax, MD [facility] Pulmonary Critical Care Medicine Sleep Medicine

## 2018-08-02 ENCOUNTER — Encounter: Payer: Self-pay | Admitting: Internal Medicine

## 2018-08-02 DIAGNOSIS — J690 Pneumonitis due to inhalation of food and vomit: Secondary | ICD-10-CM | POA: Diagnosis not present

## 2018-08-02 DIAGNOSIS — N170 Acute kidney failure with tubular necrosis: Secondary | ICD-10-CM | POA: Diagnosis present

## 2018-08-02 DIAGNOSIS — R6520 Severe sepsis without septic shock: Secondary | ICD-10-CM

## 2018-08-02 DIAGNOSIS — M6282 Rhabdomyolysis: Secondary | ICD-10-CM | POA: Diagnosis present

## 2018-08-02 DIAGNOSIS — R4182 Altered mental status, unspecified: Secondary | ICD-10-CM | POA: Diagnosis not present

## 2018-08-02 DIAGNOSIS — E039 Hypothyroidism, unspecified: Secondary | ICD-10-CM | POA: Diagnosis not present

## 2018-08-02 DIAGNOSIS — A419 Sepsis, unspecified organism: Secondary | ICD-10-CM | POA: Diagnosis present

## 2018-08-02 DIAGNOSIS — J9600 Acute respiratory failure, unspecified whether with hypoxia or hypercapnia: Secondary | ICD-10-CM | POA: Diagnosis not present

## 2018-08-02 DIAGNOSIS — F1910 Other psychoactive substance abuse, uncomplicated: Secondary | ICD-10-CM | POA: Diagnosis present

## 2018-08-02 DIAGNOSIS — J9621 Acute and chronic respiratory failure with hypoxia: Secondary | ICD-10-CM | POA: Diagnosis not present

## 2018-08-02 LAB — TRIGLYCERIDES: Triglycerides: 183 mg/dL — ABNORMAL HIGH (ref <150)

## 2018-08-02 LAB — MAGNESIUM: MAGNESIUM: 1.8 mg/dL (ref 1.7–2.4)

## 2018-08-02 LAB — PHOSPHORUS: Phosphorus: 2.9 mg/dL (ref 2.5–4.6)

## 2018-08-02 NOTE — Progress Notes (Signed)
Pulmonary Critical Care Medicine [facility] [place]   PULMONARY CRITICAL CARE SERVICE  PROGRESS NOTE  Date of Service: 08/02/2018  Mark Patterson  IRS:[id number]  DOB: [birth year]   DOA: 07/16/2018  Referring Physician: Carron Curie, MD  HPI: Mark Patterson is a 53 y.o. male seen for follow up of Acute on Chronic Respiratory Failure.  Patient remains critically ill on the ventilator.  Remains orally intubated.  Will continue to oxygenate him titrating the oxygen down as tolerated.  Medications: Reviewed on Rounds  Physical Exam:  Vitals: Temperature is 99.5 pulse 100 respiratory rate 29 blood pressure 128/70 saturations 98%  Ventilator Settings mode ventilation assist control FiO2 is 40% tidal volume 450 PEEP 5  . General: Comfortable at this time . Eyes: Grossly normal lids, irises & conjunctiva . ENT: grossly tongue is normal . Neck: no obvious mass . Cardiovascular: S1 S2 normal no gallop . Respiratory: Coarse rhonchi are noted bilaterally . Abdomen: soft . Skin: no rash seen on limited exam . Musculoskeletal: not rigid . Psychiatric:unable to assess . Neurologic: no seizure no involuntary movements         Lab Data:   Basic Metabolic Panel: Recent Labs  Lab 07/27/18 0034 07/29/18 0516 07/29/18 2333 07/30/18 0456 08/01/18 0429 08/02/18 0045  NA 129* 124*  --  126* 136  --   K 3.7 3.5  --  4.0 4.1  --   CL 95* 88*  --  88* 97*  --   CO2 25 27  --  28 30  --   GLUCOSE 103* 115*  --  145* 111*  --   BUN 13 9  --  12 20  --   CREATININE 0.50* 0.57*  --  0.52* 0.66  --   CALCIUM 9.0 8.9  --  9.6 9.7  --   MG 1.6*  --  1.7  --   --  1.8  PHOS 5.9*  --  4.9*  --   --  2.9    ABG: Recent Labs  Lab 07/27/18 1717 07/31/18 1155 07/31/18 1800  PHART 7.392 7.441 7.410  PCO2ART 47.8 47.8 49.0*  PO2ART 85.4 54.8* 157*  HCO3 28.4* 32.0* 30.4*  O2SAT 96.0 87.0 99.4    Liver Function Tests: Recent Labs  Lab 07/27/18 0034  AST 30  ALT 19  ALKPHOS  99  BILITOT 0.3  PROT 6.0*  ALBUMIN 2.4*   No results for input(s): LIPASE, AMYLASE in the last 168 hours. No results for input(s): AMMONIA in the last 168 hours.  CBC: Recent Labs  Lab 07/27/18 0034 07/29/18 0516 08/01/18 0429  WBC 4.8 4.0 15.3*  HGB 8.8* 9.1* 9.0*  HCT 26.1* 27.3* 27.0*  MCV 101.6* 99.6 102.7*  PLT 179 132* 129*    Cardiac Enzymes: No results for input(s): CKTOTAL, CKMB, CKMBINDEX, TROPONINI in the last 168 hours.  BNP (last 3 results) No results for input(s): BNP in the last 8760 hours.  ProBNP (last 3 results) No results for input(s): PROBNP in the last 8760 hours.  Radiological Exams: Ct Chest W Contrast  Result Date: 08/01/2018 CLINICAL DATA:  53 year old male with difficulty breathing EXAM: CT CHEST WITH CONTRAST TECHNIQUE: Multidetector CT imaging of the chest was performed during intravenous contrast administration. CONTRAST:  43mL OMNIPAQUE IOHEXOL 300 MG/ML  SOLN COMPARISON:  Multiple prior plain film most recent 07/31/2018 FINDINGS: Cardiovascular: Heart size within normal limits. No pericardial fluid/thickening. Calcifications of the left anterior descending coronary artery. Normal course caliber and contour of the  thoracic aorta. No aneurysm. No significant atherosclerotic changes. Unremarkable caliber of the main pulmonary artery. The contrast bolus is not timed for a sensitive evaluation of pulmonary filling defects. Mediastinum/Nodes: Multiple mediastinal lymph nodes. Index lymph nodes present in the AP window measuring 11 mm. Unremarkable appearance of the thoracic inlet. Endotracheal tube terminates in the trachea above the carina. Unremarkable course of the thoracic esophagus. Lungs/Pleura: Debris within the right mainstem bronchus crossing the origin of the right upper lobe bronchus and into the bronchus intermedius. Debris into the right lower lobe bronchi. Pattern of centrilobular nodularity of the right lower lobe in a predominantly  peribronchovascular distribution. More nodular opacities peripherally. No evidence of right-sided pleural effusion. Debris within the left lower lobe bronchus. Complete collapse of the left lower lobe with mixed low-density/intermediate density/perfusion of the tissue. Left upper lobe relatively well aerated. Mild centrilobular pattern of opacity of the left upper lobe. Centrilobular and paraseptal emphysema. Upper Abdomen: No acute finding of the upper abdomen. Musculoskeletal: No displaced fracture. Mild degenerative changes. Right upper extremity PICC. IMPRESSION: Multifocal pneumonia, most likely secondary to aspiration with debris in the right mainstem bronchus, right lower lobe bronchus, and the bronchi of the left lower lobe. Complete left lower lobe collapse, likely with debris/mucous plugging of the associated bronchi. Correlation with bronchoscopy may be useful. Endotracheal tube terminates suitably above the carina. Single vessel coronary artery disease. Electronically Signed   By: Gilmer Mor D.O.   On: 08/01/2018 12:19   Dg Chest Port 1 View  Result Date: 07/31/2018 CLINICAL DATA:  Intubation EXAM: PORTABLE CHEST 1 VIEW COMPARISON:  07/31/2018 FINDINGS: Endotracheal tube with the tip 3 cm above the carina. Right-sided PICC line with the tip projecting over the SVC. Right lung is clear. Left lower lobe airspace disease and trace pleural effusion. No pneumothorax. Stable cardiomediastinal silhouette. IMPRESSION: 1. Endotracheal tube with the tip 3 cm above the carina. 2.  Left lower lobe airspace disease and trace pleural effusion. Electronically Signed   By: Elige Ko   On: 07/31/2018 18:27   Dg Chest Port 1 View  Result Date: 07/31/2018 CLINICAL DATA:  Patient nonresponsive.  Oxygen desaturation EXAM: PORTABLE CHEST 1 VIEW COMPARISON:  July 28, 2018 FINDINGS: Airspace consolidation is noted throughout much of the left lower lobe with volume loss. There is a small left pleural effusion. The  right lung is mildly hyperexpanded but clear. Heart size and pulmonary vascularity are normal. No adenopathy. Central catheter tip is in the superior vena cava. No pneumothorax. Postoperative changes noted in the lower cervical spine. IMPRESSION: Airspace consolidation with volume loss left lower lobe. Question pneumonia versus aspiration. Small left pleural effusion. Right lung mildly hyperexpanded but clear. Heart size within normal limits. Central catheter tip in superior vena cava. No pneumothorax. Electronically Signed   By: Bretta Bang III M.D.   On: 07/31/2018 13:44    Assessment/Plan Active Problems:   Acute on chronic respiratory failure with hypoxia (HCC)   Bilateral pneumonia   Non-traumatic rhabdomyolysis   Polysubstance abuse (HCC)   Acute tubular necrosis (HCC)   Severe sepsis (HCC)   Aspiration pneumonia (HCC)   1. Acute on chronic respiratory failure with hypoxia we will continue with full vent support at this time patient RSB I will be checked.  Since we are really is pneumonia management hold off on any plans to extubate for now. 2. Nontraumatic rhabdomyolysis continue to monitor the renal function.  The last creatinine was normal. 3. Bilateral pneumonia due to aspiration continue  current management.  Patient is on antibiotics which will be continued 4. Polysubstance abuse monitor for withdrawal 5. Acute tubular necrosis resolved 6. Severe sepsis right now hemodynamically stable we will continue to monitor closely   I have personally seen and evaluated the patient, evaluated laboratory and imaging results, formulated the assessment and plan and placed orders.  Patient is critically ill in danger of cardiac arrest and death time is 35 minutes patient remains orally intubated with a high risk airway The Patient requires high complexity decision making for assessment and support.  Case was discussed on Rounds with the Respiratory Therapy Staff  Yevonne Pax, MD  [facility] Pulmonary Critical Care Medicine Sleep Medicine

## 2018-08-03 DIAGNOSIS — E039 Hypothyroidism, unspecified: Secondary | ICD-10-CM | POA: Diagnosis not present

## 2018-08-03 DIAGNOSIS — J690 Pneumonitis due to inhalation of food and vomit: Secondary | ICD-10-CM | POA: Diagnosis not present

## 2018-08-03 DIAGNOSIS — J9600 Acute respiratory failure, unspecified whether with hypoxia or hypercapnia: Secondary | ICD-10-CM | POA: Diagnosis not present

## 2018-08-03 DIAGNOSIS — N170 Acute kidney failure with tubular necrosis: Secondary | ICD-10-CM | POA: Diagnosis not present

## 2018-08-03 DIAGNOSIS — M6282 Rhabdomyolysis: Secondary | ICD-10-CM | POA: Diagnosis not present

## 2018-08-03 DIAGNOSIS — J9621 Acute and chronic respiratory failure with hypoxia: Secondary | ICD-10-CM | POA: Diagnosis not present

## 2018-08-03 DIAGNOSIS — R4182 Altered mental status, unspecified: Secondary | ICD-10-CM | POA: Diagnosis not present

## 2018-08-03 LAB — VANCOMYCIN, TROUGH: Vancomycin Tr: 19 ug/mL (ref 15–20)

## 2018-08-03 NOTE — Progress Notes (Addendum)
Pulmonary Critical Care Medicine [facility] [place]   PULMONARY CRITICAL CARE SERVICE  PROGRESS NOTE  Date of Service: 08/03/2018  Mark Patterson  EUM:[id number]  DOB: Dec 16, 1965   DOA: 07/16/2018  Referring Physician: Carron Curie, MD  HPI: Mark Patterson is a 53 y.o. male seen for follow up of Acute on Chronic Respiratory Failure.  Patient remains orally intubated on pressure support 12/5 with an FiO2 28% currently.  Continue to titrate patient per protocol.  Medications: Reviewed on Rounds  Physical Exam:  Vitals: Pulse 114 respiration 25 BP 147/93 O2 sat 96% temp 97.7  Ventilator Settings ventilator mode pressure support 12/5 FiO2 28%  . General: Comfortable at this time . Eyes: Grossly normal lids, irises & conjunctiva . ENT: grossly tongue is normal . Neck: no obvious mass . Cardiovascular: S1 S2 normal no gallop . Respiratory: Coarse breath sounds . Abdomen: soft . Skin: no rash seen on limited exam . Musculoskeletal: not rigid . Psychiatric:unable to assess . Neurologic: no seizure no involuntary movements         Lab Data:   Basic Metabolic Panel: Recent Labs  Lab 07/29/18 0516 07/29/18 2333 07/30/18 0456 08/01/18 0429 08/02/18 0045  NA 124*  --  126* 136  --   K 3.5  --  4.0 4.1  --   CL 88*  --  88* 97*  --   CO2 27  --  28 30  --   GLUCOSE 115*  --  145* 111*  --   BUN 9  --  12 20  --   CREATININE 0.57*  --  0.52* 0.66  --   CALCIUM 8.9  --  9.6 9.7  --   MG  --  1.7  --   --  1.8  PHOS  --  4.9*  --   --  2.9    ABG: Recent Labs  Lab 07/27/18 1717 07/31/18 1155 07/31/18 1800  PHART 7.392 7.441 7.410  PCO2ART 47.8 47.8 49.0*  PO2ART 85.4 54.8* 157*  HCO3 28.4* 32.0* 30.4*  O2SAT 96.0 87.0 99.4    Liver Function Tests: No results for input(s): AST, ALT, ALKPHOS, BILITOT, PROT, ALBUMIN in the last 168 hours. No results for input(s): LIPASE, AMYLASE in the last 168 hours. No results for input(s): AMMONIA in the last 168  hours.  CBC: Recent Labs  Lab 07/29/18 0516 08/01/18 0429  WBC 4.0 15.3*  HGB 9.1* 9.0*  HCT 27.3* 27.0*  MCV 99.6 102.7*  PLT 132* 129*    Cardiac Enzymes: No results for input(s): CKTOTAL, CKMB, CKMBINDEX, TROPONINI in the last 168 hours.  BNP (last 3 results) No results for input(s): BNP in the last 8760 hours.  ProBNP (last 3 results) No results for input(s): PROBNP in the last 8760 hours.  Radiological Exams: No results found.  Assessment/Plan Active Problems:   Acute on chronic respiratory failure with hypoxia (HCC)   Bilateral pneumonia   Non-traumatic rhabdomyolysis   Polysubstance abuse (HCC)   Acute tubular necrosis (HCC)   Severe sepsis (HCC)   Aspiration pneumonia (HCC)   1. Acute on chronic respiratory failure with anoxia continue with ventilator support at this time and wean per protocol as tolerated.  No plans to extubate patient at this time due to pneumonia management. 2. Nontraumatic rhabdomyolysis continue to monitor renal function 3. Bilateral pneumonia due to aspiration continue current management continue antibiotic therapy. 4. Polysubstance abuse monitor for withdrawal 5. Acute tubular necrosis resolved 6. Severe sepsis hemodynamically stable continue  to monitor   I have personally seen and evaluated the patient, evaluated laboratory and imaging results, formulated the assessment and plan and placed orders.Patient is critically ill and in danger of cardiac arrest and death time 35 min. Remains orally intubated and with high risk airway. The Patient requires high complexity decision making for assessment and support.  Case was discussed on Rounds with the Respiratory Therapy Staff  Yevonne Pax, MD [facility] Pulmonary Critical Care Medicine Sleep Medicine

## 2018-08-04 ENCOUNTER — Other Ambulatory Visit (HOSPITAL_COMMUNITY): Payer: Medicare Other

## 2018-08-04 DIAGNOSIS — E039 Hypothyroidism, unspecified: Secondary | ICD-10-CM | POA: Diagnosis not present

## 2018-08-04 DIAGNOSIS — J9600 Acute respiratory failure, unspecified whether with hypoxia or hypercapnia: Secondary | ICD-10-CM | POA: Diagnosis not present

## 2018-08-04 DIAGNOSIS — N170 Acute kidney failure with tubular necrosis: Secondary | ICD-10-CM | POA: Diagnosis not present

## 2018-08-04 DIAGNOSIS — J9690 Respiratory failure, unspecified, unspecified whether with hypoxia or hypercapnia: Secondary | ICD-10-CM | POA: Diagnosis not present

## 2018-08-04 DIAGNOSIS — J690 Pneumonitis due to inhalation of food and vomit: Secondary | ICD-10-CM | POA: Diagnosis not present

## 2018-08-04 DIAGNOSIS — J9621 Acute and chronic respiratory failure with hypoxia: Secondary | ICD-10-CM | POA: Diagnosis not present

## 2018-08-04 DIAGNOSIS — M6282 Rhabdomyolysis: Secondary | ICD-10-CM | POA: Diagnosis not present

## 2018-08-04 DIAGNOSIS — R4182 Altered mental status, unspecified: Secondary | ICD-10-CM | POA: Diagnosis not present

## 2018-08-04 NOTE — Progress Notes (Signed)
Pulmonary Critical Care Medicine [facility] [place]   PULMONARY CRITICAL CARE SERVICE  PROGRESS NOTE  Date of Service: 08/04/2018  Lanorris Krajicek  PFX:[id number]  DOB: Nov 25, 1965   DOA: 07/16/2018  Referring Physician: Carron Curie, MD  HPI: Chalmus Vandijk is a 53 y.o. male seen for follow up of Acute on Chronic Respiratory Failure.  Patient remains on the ventilator.  CT scan had shown some amount of debris present in the lung and there is also associated atelectasis.  I reviewed the scan with Dr. Manson Passey and patient may benefit from evaluation of the airway bronchoscopy to remove the debris.  We will try to get consent to get this set up.  In the meantime patient remains orally intubated on the ventilator.  Medications: Reviewed on Rounds  Physical Exam:  Vitals: Temperature is 97.7 pulse was 112 respiratory rate 23 saturations 96%  Ventilator Settings mode ventilation was pressure support with a 28% FiO2  . General: Comfortable at this time . Eyes: Grossly normal lids, irises & conjunctiva . ENT: grossly tongue is normal . Neck: no obvious mass . Cardiovascular: S1 S2 normal no gallop . Respiratory: Coarse breath sounds with rhonchi are noted bilaterally . Abdomen: soft . Skin: no rash seen on limited exam . Musculoskeletal: not rigid . Psychiatric:unable to assess . Neurologic: no seizure no involuntary movements         Lab Data:   Basic Metabolic Panel: Recent Labs  Lab 07/29/18 0516 07/29/18 2333 07/30/18 0456 08/01/18 0429 08/02/18 0045  NA 124*  --  126* 136  --   K 3.5  --  4.0 4.1  --   CL 88*  --  88* 97*  --   CO2 27  --  28 30  --   GLUCOSE 115*  --  145* 111*  --   BUN 9  --  12 20  --   CREATININE 0.57*  --  0.52* 0.66  --   CALCIUM 8.9  --  9.6 9.7  --   MG  --  1.7  --   --  1.8  PHOS  --  4.9*  --   --  2.9    ABG: Recent Labs  Lab 07/31/18 1155 07/31/18 1800  PHART 7.441 7.410  PCO2ART 47.8 49.0*  PO2ART 54.8* 157*  HCO3  32.0* 30.4*  O2SAT 87.0 99.4    Liver Function Tests: No results for input(s): AST, ALT, ALKPHOS, BILITOT, PROT, ALBUMIN in the last 168 hours. No results for input(s): LIPASE, AMYLASE in the last 168 hours. No results for input(s): AMMONIA in the last 168 hours.  CBC: Recent Labs  Lab 07/29/18 0516 08/01/18 0429  WBC 4.0 15.3*  HGB 9.1* 9.0*  HCT 27.3* 27.0*  MCV 99.6 102.7*  PLT 132* 129*    Cardiac Enzymes: No results for input(s): CKTOTAL, CKMB, CKMBINDEX, TROPONINI in the last 168 hours.  BNP (last 3 results) No results for input(s): BNP in the last 8760 hours.  ProBNP (last 3 results) No results for input(s): PROBNP in the last 8760 hours.  Radiological Exams: No results found.  Assessment/Plan Active Problems:   Acute on chronic respiratory failure with hypoxia (HCC)   Bilateral pneumonia   Non-traumatic rhabdomyolysis   Polysubstance abuse (HCC)   Acute tubular necrosis (HCC)   Severe sepsis (HCC)   Aspiration pneumonia (HCC)   1. Acute on chronic respiratory failure with hypoxia patient likely aspirated there was debris seen on CT scan from about 3 days  ago.  Conservative measures being attempted with aggressive pulmonary toileting secretions to be removed.  We will get a follow-up chest x-ray to see if there still is significant atelectasis if so then will schedule with bronchoscopy. 2. Bilateral pneumonia empiric antibiotics for probable aspiration continue with supportive care 3. Nontraumatic rhabdomyolysis continue to monitor renal function adequate hydration 4. ATN secondary to above we will continue to monitor labs 5. Polysubstance abuse patient is at baseline 6. Severe sepsis right now hemodynamically stable we will continue to monitor.   I have personally seen and evaluated the patient, evaluated laboratory and imaging results, formulated the assessment and plan and placed orders.  Patient remains critically ill in danger of cardiac arrest and  death time 35 minutes critical care The Patient requires high complexity decision making for assessment and support.  Case was discussed on Rounds with the Respiratory Therapy Staff  Yevonne Pax, MD [facility] Pulmonary Critical Care Medicine Sleep Medicine

## 2018-08-05 DIAGNOSIS — J9600 Acute respiratory failure, unspecified whether with hypoxia or hypercapnia: Secondary | ICD-10-CM | POA: Diagnosis not present

## 2018-08-05 DIAGNOSIS — N170 Acute kidney failure with tubular necrosis: Secondary | ICD-10-CM | POA: Diagnosis not present

## 2018-08-05 DIAGNOSIS — J690 Pneumonitis due to inhalation of food and vomit: Secondary | ICD-10-CM | POA: Diagnosis not present

## 2018-08-05 DIAGNOSIS — R4182 Altered mental status, unspecified: Secondary | ICD-10-CM | POA: Diagnosis not present

## 2018-08-05 DIAGNOSIS — J9621 Acute and chronic respiratory failure with hypoxia: Secondary | ICD-10-CM | POA: Diagnosis not present

## 2018-08-05 DIAGNOSIS — E039 Hypothyroidism, unspecified: Secondary | ICD-10-CM | POA: Diagnosis not present

## 2018-08-05 DIAGNOSIS — F1910 Other psychoactive substance abuse, uncomplicated: Secondary | ICD-10-CM | POA: Diagnosis not present

## 2018-08-05 LAB — MAGNESIUM: Magnesium: 1.9 mg/dL (ref 1.7–2.4)

## 2018-08-05 LAB — CBC
HEMATOCRIT: 24.9 % — AB (ref 39.0–52.0)
Hemoglobin: 8.3 g/dL — ABNORMAL LOW (ref 13.0–17.0)
MCH: 34.2 pg — ABNORMAL HIGH (ref 26.0–34.0)
MCHC: 33.3 g/dL (ref 30.0–36.0)
MCV: 102.5 fL — ABNORMAL HIGH (ref 80.0–100.0)
Platelets: 90 10*3/uL — ABNORMAL LOW (ref 150–400)
RBC: 2.43 MIL/uL — ABNORMAL LOW (ref 4.22–5.81)
RDW: 13.2 % (ref 11.5–15.5)
WBC: 10.2 10*3/uL (ref 4.0–10.5)
nRBC: 0 % (ref 0.0–0.2)

## 2018-08-05 LAB — PHOSPHORUS: Phosphorus: 3.3 mg/dL (ref 2.5–4.6)

## 2018-08-05 LAB — BASIC METABOLIC PANEL
Anion gap: 5 (ref 5–15)
BUN: 18 mg/dL (ref 6–20)
CO2: 25 mmol/L (ref 22–32)
Calcium: 9.4 mg/dL (ref 8.9–10.3)
Chloride: 104 mmol/L (ref 98–111)
Creatinine, Ser: 0.51 mg/dL — ABNORMAL LOW (ref 0.61–1.24)
GFR calc non Af Amer: >60 mL/min (ref >60)
Glucose, Bld: 178 mg/dL — ABNORMAL HIGH (ref 70–99)
Potassium: 3.4 mmol/L — ABNORMAL LOW (ref 3.5–5.1)
Sodium: 134 mmol/L — ABNORMAL LOW (ref 135–145)

## 2018-08-05 LAB — TRIGLYCERIDES: Triglycerides: 122 mg/dL (ref <150)

## 2018-08-05 NOTE — Progress Notes (Signed)
Pulmonary Critical Care Medicine [facility] [place]   PULMONARY CRITICAL CARE SERVICE  PROGRESS NOTE  Date of Service: 08/05/2018  Mark Patterson  CNO:[id number]  DOB: December 03, 1965   DOA: 07/16/2018  Referring Physician: Carron Curie, MD  HPI: Mark Patterson is a 53 y.o. male seen for follow up of Acute on Chronic Respiratory Failure.  Patient is doing better the chest x-ray that was done shows increased improve aeration of the left lung so will likely mucous plug which has been removed and patient seems to be doing better now.  Spoke with the respiratory therapy during rounds to continue to try to advance weaning.  Medications: Reviewed on Rounds  Physical Exam:  Vitals: Temperature 96.8 pulse 56 respiratory rate 16 blood pressure 163/93 saturations 100%  Ventilator Settings patient is on pressure support mode right now pressure 12/5  . General: Comfortable at this time . Eyes: Grossly normal lids, irises & conjunctiva . ENT: grossly tongue is normal . Neck: no obvious mass . Cardiovascular: S1 S2 normal no gallop . Respiratory: No rhonchi improved aeration . Abdomen: soft . Skin: no rash seen on limited exam . Musculoskeletal: not rigid . Psychiatric:unable to assess . Neurologic: no seizure no involuntary movements         Lab Data:   Basic Metabolic Panel: Recent Labs  Lab 07/29/18 2333 07/30/18 0456 08/01/18 0429 08/02/18 0045 08/05/18 0432  NA  --  126* 136  --  134*  K  --  4.0 4.1  --  3.4*  CL  --  88* 97*  --  104  CO2  --  28 30  --  25  GLUCOSE  --  145* 111*  --  178*  BUN  --  12 20  --  18  CREATININE  --  0.52* 0.66  --  0.51*  CALCIUM  --  9.6 9.7  --  9.4  MG 1.7  --   --  1.8 1.9  PHOS 4.9*  --   --  2.9 3.3    ABG: Recent Labs  Lab 07/31/18 1155 07/31/18 1800  PHART 7.441 7.410  PCO2ART 47.8 49.0*  PO2ART 54.8* 157*  HCO3 32.0* 30.4*  O2SAT 87.0 99.4    Liver Function Tests: No results for input(s): AST, ALT, ALKPHOS,  BILITOT, PROT, ALBUMIN in the last 168 hours. No results for input(s): LIPASE, AMYLASE in the last 168 hours. No results for input(s): AMMONIA in the last 168 hours.  CBC: Recent Labs  Lab 08/01/18 0429 08/05/18 0432  WBC 15.3* 10.2  HGB 9.0* 8.3*  HCT 27.0* 24.9*  MCV 102.7* 102.5*  PLT 129* 90*    Cardiac Enzymes: No results for input(s): CKTOTAL, CKMB, CKMBINDEX, TROPONINI in the last 168 hours.  BNP (last 3 results) No results for input(s): BNP in the last 8760 hours.  ProBNP (last 3 results) No results for input(s): PROBNP in the last 8760 hours.  Radiological Exams: Dg Chest Port 1 View  Result Date: 08/04/2018 CLINICAL DATA:  Respiratory failure EXAM: PORTABLE CHEST 1 VIEW COMPARISON:  CT 08/01/2018.  Plain film 07/31/2018. FINDINGS: Endotracheal tube terminates 4.5 cm above carina. Right-sided PICC line tip at mid SVC. Normal heart size. No pleural effusion or pneumothorax. Improved left-sided aeration. Left infrahilar and medial left lower lobe airspace disease remains. The right lung remains clear, with artifact projecting over the right suprahilar region. IMPRESSION: Improved left-sided airspace disease, likely aspiration or pneumonia. Electronically Signed   By: Hosie Spangle.D.  On: 08/04/2018 13:21    Assessment/Plan Active Problems:   Acute on chronic respiratory failure with hypoxia (HCC)   Bilateral pneumonia   Non-traumatic rhabdomyolysis   Polysubstance abuse (HCC)   Acute tubular necrosis (HCC)   Severe sepsis (HCC)   Aspiration pneumonia (HCC)   1. Acute on chronic respiratory failure with hypoxia we will proceed with weaning on pressure support and try to plan on extubating the patient.  And there has been improvement in the aeration on the left side as noted 2. Bilateral pneumonia treated we will continue to follow radiologically 3. Polysubstance abuse we will monitor 4. ATN labs improving we will continue to monitor labs. 5. Severe sepsis  resolved hemodynamically stable 6. Aspiration pneumonia which was likely the cause of his downturn will continue to monitor radiologically right now he is doing better   I have personally seen and evaluated the patient, evaluated laboratory and imaging results, formulated the assessment and plan and placed orders.  Time 35 minutes patient critically ill danger of cardiac arrest and death has a high risk airway orally intubated The Patient requires high complexity decision making for assessment and support.  Case was discussed on Rounds with the Respiratory Therapy Staff  Yevonne Pax, MD [facility] Pulmonary Critical Care Medicine Sleep Medicine

## 2018-08-06 DIAGNOSIS — J9600 Acute respiratory failure, unspecified whether with hypoxia or hypercapnia: Secondary | ICD-10-CM | POA: Diagnosis not present

## 2018-08-06 DIAGNOSIS — R4182 Altered mental status, unspecified: Secondary | ICD-10-CM | POA: Diagnosis not present

## 2018-08-06 DIAGNOSIS — J9621 Acute and chronic respiratory failure with hypoxia: Secondary | ICD-10-CM | POA: Diagnosis not present

## 2018-08-06 DIAGNOSIS — E039 Hypothyroidism, unspecified: Secondary | ICD-10-CM | POA: Diagnosis not present

## 2018-08-06 DIAGNOSIS — N170 Acute kidney failure with tubular necrosis: Secondary | ICD-10-CM | POA: Diagnosis not present

## 2018-08-06 DIAGNOSIS — F1910 Other psychoactive substance abuse, uncomplicated: Secondary | ICD-10-CM | POA: Diagnosis not present

## 2018-08-06 DIAGNOSIS — J690 Pneumonitis due to inhalation of food and vomit: Secondary | ICD-10-CM | POA: Diagnosis not present

## 2018-08-06 LAB — BLOOD GAS, ARTERIAL
Acid-Base Excess: 2.6 mmol/L — ABNORMAL HIGH (ref 0.0–2.0)
Bicarbonate: 26.6 mmol/L (ref 20.0–28.0)
O2 Content: 4 L/min
O2 Saturation: 94.8 %
PH ART: 7.433 (ref 7.350–7.450)
PO2 ART: 73.6 mmHg — AB (ref 83.0–108.0)
Patient temperature: 98.6
pCO2 arterial: 40.5 mmHg (ref 32.0–48.0)

## 2018-08-06 NOTE — Progress Notes (Addendum)
Pulmonary Critical Care Medicine [facility] [place]   PULMONARY CRITICAL CARE SERVICE  PROGRESS NOTE  Date of Service: 08/06/2018  Mark Patterson  QIO:[id number]  DOB: 1965/05/11   DOA: 07/16/2018  Referring Physician: Carron Curie, MD  HPI: Mark Patterson is a 53 y.o. male seen for follow up of Acute on Chronic Respiratory Failure.  Patient managed to extubate himself overnight last night.  He is alert and oriented today on 4 L of oxygen via nasal cannula.  He does have orders for BiPAP PRN however has not needed them since extubating himself.  Currently doing well with minimal secretions.  Medications: Reviewed on Rounds  Physical Exam:  Vitals: Pulse 71 respirations 13 BP 57/95 O2 sat 100% temp 96.0  Ventilator Settings patient not currently on ventilator.  . General: Comfortable at this time . Eyes: Grossly normal lids, irises & conjunctiva . ENT: grossly tongue is normal . Neck: no obvious mass . Cardiovascular: S1 S2 normal no gallop . Respiratory: No rales or rhonchi noted . Abdomen: soft . Skin: no rash seen on limited exam . Musculoskeletal: not rigid . Psychiatric:unable to assess . Neurologic: no seizure no involuntary movements         Lab Data:   Basic Metabolic Panel: Recent Labs  Lab 08/01/18 0429 08/02/18 0045 08/05/18 0432  NA 136  --  134*  K 4.1  --  3.4*  CL 97*  --  104  CO2 30  --  25  GLUCOSE 111*  --  178*  BUN 20  --  18  CREATININE 0.66  --  0.51*  CALCIUM 9.7  --  9.4  MG  --  1.8 1.9  PHOS  --  2.9 3.3    ABG: Recent Labs  Lab 07/31/18 1155 07/31/18 1800 08/06/18 0050  PHART 7.441 7.410 7.433  PCO2ART 47.8 49.0* 40.5  PO2ART 54.8* 157* 73.6*  HCO3 32.0* 30.4* 26.6  O2SAT 87.0 99.4 94.8    Liver Function Tests: No results for input(s): AST, ALT, ALKPHOS, BILITOT, PROT, ALBUMIN in the last 168 hours. No results for input(s): LIPASE, AMYLASE in the last 168 hours. No results for input(s): AMMONIA in the last 168  hours.  CBC: Recent Labs  Lab 08/01/18 0429 08/05/18 0432  WBC 15.3* 10.2  HGB 9.0* 8.3*  HCT 27.0* 24.9*  MCV 102.7* 102.5*  PLT 129* 90*    Cardiac Enzymes: No results for input(s): CKTOTAL, CKMB, CKMBINDEX, TROPONINI in the last 168 hours.  BNP (last 3 results) No results for input(s): BNP in the last 8760 hours.  ProBNP (last 3 results) No results for input(s): PROBNP in the last 8760 hours.  Radiological Exams: No results found.  Assessment/Plan Active Problems:   Acute on chronic respiratory failure with hypoxia (HCC)   Bilateral pneumonia   Non-traumatic rhabdomyolysis   Polysubstance abuse (HCC)   Acute tubular necrosis (HCC)   Severe sepsis (HCC)   Aspiration pneumonia (HCC)   1. Acute on chronic respiratory failure with hypoxia patient extubated himself last night and is currently on 4 L of oxygen via nasal cannula and doing well.  Has orders for BiPAP as needed however has not needed.  Continue to monitor patient's progress. 2. Bilateral pneumonia treated continue to follow radiologically 3. Polysubstance abuse continue to monitor 4. ATN labs improving continue to monitor labs 5. Severe sepsis resolved dynamically stable 6. Aspiration pneumonia continue to monitor.   I have personally seen and evaluated the patient, evaluated laboratory and imaging results,  formulated the assessment and plan and placed orders. The Patient requires high complexity decision making for assessment and support.  Case was discussed on Rounds with the Respiratory Therapy Staff  Allyne Gee, MD [facility] Pulmonary Critical Care Medicine Sleep Medicine

## 2018-08-07 DIAGNOSIS — J9621 Acute and chronic respiratory failure with hypoxia: Secondary | ICD-10-CM | POA: Diagnosis not present

## 2018-08-07 DIAGNOSIS — F1910 Other psychoactive substance abuse, uncomplicated: Secondary | ICD-10-CM | POA: Diagnosis not present

## 2018-08-07 DIAGNOSIS — N170 Acute kidney failure with tubular necrosis: Secondary | ICD-10-CM | POA: Diagnosis not present

## 2018-08-07 DIAGNOSIS — J690 Pneumonitis due to inhalation of food and vomit: Secondary | ICD-10-CM | POA: Diagnosis not present

## 2018-08-07 DIAGNOSIS — J9600 Acute respiratory failure, unspecified whether with hypoxia or hypercapnia: Secondary | ICD-10-CM | POA: Diagnosis not present

## 2018-08-07 DIAGNOSIS — J181 Lobar pneumonia, unspecified organism: Secondary | ICD-10-CM | POA: Diagnosis not present

## 2018-08-07 DIAGNOSIS — R4182 Altered mental status, unspecified: Secondary | ICD-10-CM | POA: Diagnosis not present

## 2018-08-07 DIAGNOSIS — N179 Acute kidney failure, unspecified: Secondary | ICD-10-CM | POA: Diagnosis not present

## 2018-08-07 NOTE — Progress Notes (Addendum)
Pulmonary Critical Care Medicine [facility] [place]   PULMONARY CRITICAL CARE SERVICE  PROGRESS NOTE  Date of Service: 08/07/2018  Mark Patterson  XUX:[id number]  DOB: Apr 26, 1966   DOA: 07/16/2018  Referring Physician: Carron Curie, MD  HPI: Mark Patterson is a 53 y.o. male seen for follow up of Acute on Chronic Respiratory Failure.  Patient continues to do well since his self extubation.  He is currently on 2 L of oxygen via nasal cannula and has not needed the BiPAP at all.  Medications: Reviewed on Rounds  Physical Exam:  Vitals: Pulse 70 respirations 22 BP 160/94 O2 sat 94% temp 96.4  Ventilator Settings not currently on ventilator  . General: Comfortable at this time . Eyes: Grossly normal lids, irises & conjunctiva . ENT: grossly tongue is normal . Neck: no obvious mass . Cardiovascular: S1 S2 normal no gallop . Respiratory: No rales or rhonchi noted . Abdomen: soft . Skin: no rash seen on limited exam . Musculoskeletal: not rigid . Psychiatric:unable to assess . Neurologic: no seizure no involuntary movements         Lab Data:   Basic Metabolic Panel: Recent Labs  Lab 08/01/18 0429 08/02/18 0045 08/05/18 0432  NA 136  --  134*  K 4.1  --  3.4*  CL 97*  --  104  CO2 30  --  25  GLUCOSE 111*  --  178*  BUN 20  --  18  CREATININE 0.66  --  0.51*  CALCIUM 9.7  --  9.4  MG  --  1.8 1.9  PHOS  --  2.9 3.3    ABG: Recent Labs  Lab 07/31/18 1800 08/06/18 0050  PHART 7.410 7.433  PCO2ART 49.0* 40.5  PO2ART 157* 73.6*  HCO3 30.4* 26.6  O2SAT 99.4 94.8    Liver Function Tests: No results for input(s): AST, ALT, ALKPHOS, BILITOT, PROT, ALBUMIN in the last 168 hours. No results for input(s): LIPASE, AMYLASE in the last 168 hours. No results for input(s): AMMONIA in the last 168 hours.  CBC: Recent Labs  Lab 08/01/18 0429 08/05/18 0432  WBC 15.3* 10.2  HGB 9.0* 8.3*  HCT 27.0* 24.9*  MCV 102.7* 102.5*  PLT 129* 90*    Cardiac  Enzymes: No results for input(s): CKTOTAL, CKMB, CKMBINDEX, TROPONINI in the last 168 hours.  BNP (last 3 results) No results for input(s): BNP in the last 8760 hours.  ProBNP (last 3 results) No results for input(s): PROBNP in the last 8760 hours.  Radiological Exams: No results found.  Assessment/Plan Active Problems:   Acute on chronic respiratory failure with hypoxia (HCC)   Bilateral pneumonia   Non-traumatic rhabdomyolysis   Polysubstance abuse (HCC)   Acute tubular necrosis (HCC)   Severe sepsis (HCC)   Aspiration pneumonia (HCC)   1. Acute on chronic respiratory failure with hypoxia patient extubated himself and continues to do well on 2 L of oxygen via nasal cannula at this time.  He has not needed the BiPAP.  Continue pulmonary toilet and secretion management 2. A lateral pneumonia treated continue to follow next polysubstance abuse continue to monitor 3. ATN labs improving continue to monitor 4. Severe sepsis resolved hemodynamically stable 5. Aspiration pneumonia continue to monitor   I have personally seen and evaluated the patient, evaluated laboratory and imaging results, formulated the assessment and plan and placed orders. The Patient requires high complexity decision making for assessment and support.  Case was discussed on Rounds with the Respiratory Therapy  Staff  Allyne Gee, MD [facility] Pulmonary Critical Care Medicine Sleep Medicine

## 2018-08-08 ENCOUNTER — Other Ambulatory Visit (HOSPITAL_COMMUNITY): Payer: Medicare Other

## 2018-08-08 DIAGNOSIS — F1910 Other psychoactive substance abuse, uncomplicated: Secondary | ICD-10-CM | POA: Diagnosis not present

## 2018-08-08 DIAGNOSIS — E039 Hypothyroidism, unspecified: Secondary | ICD-10-CM | POA: Diagnosis not present

## 2018-08-08 DIAGNOSIS — J690 Pneumonitis due to inhalation of food and vomit: Secondary | ICD-10-CM | POA: Diagnosis not present

## 2018-08-08 DIAGNOSIS — J9621 Acute and chronic respiratory failure with hypoxia: Secondary | ICD-10-CM | POA: Diagnosis not present

## 2018-08-08 DIAGNOSIS — R4182 Altered mental status, unspecified: Secondary | ICD-10-CM | POA: Diagnosis not present

## 2018-08-08 DIAGNOSIS — N170 Acute kidney failure with tubular necrosis: Secondary | ICD-10-CM | POA: Diagnosis not present

## 2018-08-08 DIAGNOSIS — L89150 Pressure ulcer of sacral region, unstageable: Secondary | ICD-10-CM | POA: Diagnosis not present

## 2018-08-08 DIAGNOSIS — J9600 Acute respiratory failure, unspecified whether with hypoxia or hypercapnia: Secondary | ICD-10-CM | POA: Diagnosis not present

## 2018-08-08 LAB — CBC
HCT: 24.9 % — ABNORMAL LOW (ref 39.0–52.0)
Hemoglobin: 8.4 g/dL — ABNORMAL LOW (ref 13.0–17.0)
MCH: 34.3 pg — ABNORMAL HIGH (ref 26.0–34.0)
MCHC: 33.7 g/dL (ref 30.0–36.0)
MCV: 101.6 fL — ABNORMAL HIGH (ref 80.0–100.0)
Platelets: 115 10*3/uL — ABNORMAL LOW (ref 150–400)
RBC: 2.45 MIL/uL — ABNORMAL LOW (ref 4.22–5.81)
RDW: 13.9 % (ref 11.5–15.5)
WBC: 13.3 10*3/uL — ABNORMAL HIGH (ref 4.0–10.5)
nRBC: 0.3 % — ABNORMAL HIGH (ref 0.0–0.2)

## 2018-08-08 LAB — BASIC METABOLIC PANEL
Anion gap: 7 (ref 5–15)
BUN: 13 mg/dL (ref 6–20)
CO2: 29 mmol/L (ref 22–32)
Calcium: 9.2 mg/dL (ref 8.9–10.3)
Chloride: 101 mmol/L (ref 98–111)
Creatinine, Ser: 0.39 mg/dL — ABNORMAL LOW (ref 0.61–1.24)
GFR calc Af Amer: >60 mL/min (ref >60)
GFR calc non Af Amer: >60 mL/min (ref >60)
Glucose, Bld: 124 mg/dL — ABNORMAL HIGH (ref 70–99)
Potassium: 3 mmol/L — ABNORMAL LOW (ref 3.5–5.1)
Sodium: 137 mmol/L (ref 135–145)

## 2018-08-08 LAB — TRIGLYCERIDES: Triglycerides: 55 mg/dL (ref <150)

## 2018-08-08 LAB — PHOSPHORUS: Phosphorus: 3.7 mg/dL (ref 2.5–4.6)

## 2018-08-08 LAB — MAGNESIUM: Magnesium: 1.7 mg/dL (ref 1.7–2.4)

## 2018-08-08 NOTE — Progress Notes (Signed)
Pulmonary Critical Care Medicine [facility] [place]   PULMONARY CRITICAL CARE SERVICE  PROGRESS NOTE  Date of Service: 08/08/2018  Mark Patterson  YME:[id number]  DOB: [birth year]   DOA: 07/16/2018  Referring Physician: Carron Curie, MD  HPI: Mark Patterson is a 53 y.o. male seen for follow up of Acute on Chronic Respiratory Failure.  Patient right now is comfortable without distress status post self extubation.  Medications: Reviewed on Rounds  Physical Exam:  Vitals: Temperature 97.7 pulse 80 respiratory 20 blood pressure 150/70 saturations 98%  Ventilator Settings off ventilator on room air  . General: Comfortable at this time . Eyes: Grossly normal lids, irises & conjunctiva . ENT: grossly tongue is normal . Neck: no obvious mass . Cardiovascular: S1 S2 normal no gallop . Respiratory: No rhonchi or rales are noted at this time . Abdomen: soft . Skin: no rash seen on limited exam . Musculoskeletal: not rigid . Psychiatric:unable to assess . Neurologic: no seizure no involuntary movements         Lab Data:   Basic Metabolic Panel: Recent Labs  Lab 08/02/18 0045 08/05/18 0432 08/08/18 0729  NA  --  134* 137  K  --  3.4* 3.0*  CL  --  104 101  CO2  --  25 29  GLUCOSE  --  178* 124*  BUN  --  18 13  CREATININE  --  0.51* 0.39*  CALCIUM  --  9.4 9.2  MG 1.8 1.9 1.7  PHOS 2.9 3.3 3.7    ABG: Recent Labs  Lab 08/06/18 0050  PHART 7.433  PCO2ART 40.5  PO2ART 73.6*  HCO3 26.6  O2SAT 94.8    Liver Function Tests: No results for input(s): AST, ALT, ALKPHOS, BILITOT, PROT, ALBUMIN in the last 168 hours. No results for input(s): LIPASE, AMYLASE in the last 168 hours. No results for input(s): AMMONIA in the last 168 hours.  CBC: Recent Labs  Lab 08/05/18 0432 08/08/18 0729  WBC 10.2 13.3*  HGB 8.3* 8.4*  HCT 24.9* 24.9*  MCV 102.5* 101.6*  PLT 90* 115*    Cardiac Enzymes: No results for input(s): CKTOTAL, CKMB, CKMBINDEX, TROPONINI in  the last 168 hours.  BNP (last 3 results) No results for input(s): BNP in the last 8760 hours.  ProBNP (last 3 results) No results for input(s): PROBNP in the last 8760 hours.  Radiological Exams: No results found.  Assessment/Plan Active Problems:   Acute on chronic respiratory failure with hypoxia (HCC)   Bilateral pneumonia   Non-traumatic rhabdomyolysis   Polysubstance abuse (HCC)   Acute tubular necrosis (HCC)   Severe sepsis (HCC)   Aspiration pneumonia (HCC)   1. Acute on chronic respiratory failure hypoxia we will continue with oxygen therapy supportive care as necessary.  Patient is extubated off the ventilator. 2. Bilateral pneumonia treated clinically improving 3. Nontraumatic rhabdomyolysis improved 4. Polysubstance abuse at baseline 5. ATN resolved we will monitor 6. Severe sepsis resolved 7. Aspiration pneumonia treated resolved we will continue with supportive care   I have personally seen and evaluated the patient, evaluated laboratory and imaging results, formulated the assessment and plan and placed orders. The Patient requires high complexity decision making for assessment and support.  Case was discussed on Rounds with the Respiratory Therapy Staff  Yevonne Pax, MD [facility] Pulmonary Critical Care Medicine Sleep Medicine

## 2018-08-09 DIAGNOSIS — J9621 Acute and chronic respiratory failure with hypoxia: Secondary | ICD-10-CM | POA: Diagnosis not present

## 2018-08-09 DIAGNOSIS — N170 Acute kidney failure with tubular necrosis: Secondary | ICD-10-CM | POA: Diagnosis not present

## 2018-08-09 DIAGNOSIS — J9600 Acute respiratory failure, unspecified whether with hypoxia or hypercapnia: Secondary | ICD-10-CM | POA: Diagnosis not present

## 2018-08-09 DIAGNOSIS — F1910 Other psychoactive substance abuse, uncomplicated: Secondary | ICD-10-CM | POA: Diagnosis not present

## 2018-08-09 DIAGNOSIS — A419 Sepsis, unspecified organism: Secondary | ICD-10-CM | POA: Diagnosis not present

## 2018-08-09 DIAGNOSIS — J690 Pneumonitis due to inhalation of food and vomit: Secondary | ICD-10-CM | POA: Diagnosis not present

## 2018-08-09 DIAGNOSIS — J181 Lobar pneumonia, unspecified organism: Secondary | ICD-10-CM | POA: Diagnosis not present

## 2018-08-09 LAB — POTASSIUM: Potassium: 2.9 mmol/L — ABNORMAL LOW (ref 3.5–5.1)

## 2018-08-09 NOTE — Progress Notes (Addendum)
Pulmonary Critical Care Medicine [facility] [place]   PULMONARY CRITICAL CARE SERVICE  PROGRESS NOTE  Date of Service: 08/09/2018  Mark Patterson  XBL:[id number]  DOB: [birth year]   DOA: 07/16/2018  Referring Physician: Carron Curie, MD  HPI: Mark Patterson is a 53 y.o. male seen for follow up of Acute on Chronic Respiratory Failure.  Patient continues to rest comfortably he is on room air during the day and 2 L of oxygen via nasal cannula at night.  Denies any current need.  Medications: Reviewed on Rounds  Physical Exam:  Vitals: Pulse 89 respirations 21 BP 183/99 O2 sat 98% temp 97.2  Ventilator Settings currently on room air off ventilator  . General: Comfortable at this time . Eyes: Grossly normal lids, irises & conjunctiva . ENT: grossly tongue is normal . Neck: no obvious mass . Cardiovascular: S1 S2 normal no gallop . Respiratory: No rales or rhonchi noted . Abdomen: soft . Skin: no rash seen on limited exam . Musculoskeletal: not rigid . Psychiatric:unable to assess . Neurologic: no seizure no involuntary movements         Lab Data:   Basic Metabolic Panel: Recent Labs  Lab 08/05/18 0432 08/08/18 0729 08/09/18 0408  NA 134* 137  --   K 3.4* 3.0* 2.9*  CL 104 101  --   CO2 25 29  --   GLUCOSE 178* 124*  --   BUN 18 13  --   CREATININE 0.51* 0.39*  --   CALCIUM 9.4 9.2  --   MG 1.9 1.7  --   PHOS 3.3 3.7  --     ABG: Recent Labs  Lab 08/06/18 0050  PHART 7.433  PCO2ART 40.5  PO2ART 73.6*  HCO3 26.6  O2SAT 94.8    Liver Function Tests: No results for input(s): AST, ALT, ALKPHOS, BILITOT, PROT, ALBUMIN in the last 168 hours. No results for input(s): LIPASE, AMYLASE in the last 168 hours. No results for input(s): AMMONIA in the last 168 hours.  CBC: Recent Labs  Lab 08/05/18 0432 08/08/18 0729  WBC 10.2 13.3*  HGB 8.3* 8.4*  HCT 24.9* 24.9*  MCV 102.5* 101.6*  PLT 90* 115*    Cardiac Enzymes: No results for input(s):  CKTOTAL, CKMB, CKMBINDEX, TROPONINI in the last 168 hours.  BNP (last 3 results) No results for input(s): BNP in the last 8760 hours.  ProBNP (last 3 results) No results for input(s): PROBNP in the last 8760 hours.  Radiological Exams: No results found.  Assessment/Plan Active Problems:   Acute on chronic respiratory failure with hypoxia (HCC)   Bilateral pneumonia   Non-traumatic rhabdomyolysis   Polysubstance abuse (HCC)   Acute tubular necrosis (HCC)   Severe sepsis (HCC)   Aspiration pneumonia (HCC)   1. Acute on chronic respiratory with hypoxia continue with oxygen therapy and supportive care as needed. 2. Bilateral pneumonia treated clinically improving 3. Nontraumatic rhabdomyolysis improved 4. Polysubstance abuse at baseline 5. ATN resolved continue to monitor 6. Severe sepsis resolved 7. Aspiration pneumonia treated continue supportive care   I have personally seen and evaluated the patient, evaluated laboratory and imaging results, formulated the assessment and plan and placed orders. The Patient requires high complexity decision making for assessment and support.  Case was discussed on Rounds with the Respiratory Therapy Staff  Yevonne Pax, MD [facility] Pulmonary Critical Care Medicine Sleep Medicine

## 2018-08-10 DIAGNOSIS — J690 Pneumonitis due to inhalation of food and vomit: Secondary | ICD-10-CM | POA: Diagnosis not present

## 2018-08-10 DIAGNOSIS — J9600 Acute respiratory failure, unspecified whether with hypoxia or hypercapnia: Secondary | ICD-10-CM | POA: Diagnosis not present

## 2018-08-10 DIAGNOSIS — J9621 Acute and chronic respiratory failure with hypoxia: Secondary | ICD-10-CM | POA: Diagnosis not present

## 2018-08-10 DIAGNOSIS — F1910 Other psychoactive substance abuse, uncomplicated: Secondary | ICD-10-CM | POA: Diagnosis not present

## 2018-08-10 DIAGNOSIS — J181 Lobar pneumonia, unspecified organism: Secondary | ICD-10-CM | POA: Diagnosis not present

## 2018-08-10 DIAGNOSIS — A419 Sepsis, unspecified organism: Secondary | ICD-10-CM | POA: Diagnosis not present

## 2018-08-10 DIAGNOSIS — N170 Acute kidney failure with tubular necrosis: Secondary | ICD-10-CM | POA: Diagnosis not present

## 2018-08-10 LAB — POTASSIUM: Potassium: 3.5 mmol/L (ref 3.5–5.1)

## 2018-08-10 NOTE — Progress Notes (Addendum)
Pulmonary Critical Care Medicine [facility] [place]   PULMONARY CRITICAL CARE SERVICE  PROGRESS NOTE  Date of Service: 08/10/2018  Mark Patterson  OAC:[id number]  DOB: [birth year]   DOA: 07/16/2018  Referring Physician: Carron Curie, MD  HPI: Mark Patterson is a 53 y.o. male seen for follow up of Acute on Chronic Respiratory Failure.  Patient had an event where he aspirated this morning.  He went for 2 L nasal cannula to 4 L nasal cannula.  Respiratory therapy tried to NTS the patient however he still has a good cough.  Medications: Reviewed on Rounds  Physical Exam:  Vitals: Pulse 91 respirations 22 BP 164/60 O2 sat 98% temp 97.4  Ventilator Settings not currently on ventilator  . General: Comfortable at this time . Eyes: Grossly normal lids, irises & conjunctiva . ENT: grossly tongue is normal . Neck: no obvious mass . Cardiovascular: S1 S2 normal no gallop . Respiratory: No rales or rhonchi noted . Abdomen: soft . Skin: no rash seen on limited exam . Musculoskeletal: not rigid . Psychiatric:unable to assess . Neurologic: no seizure no involuntary movements         Lab Data:   Basic Metabolic Panel: Recent Labs  Lab 08/05/18 0432 08/08/18 0729 08/09/18 0408 08/10/18 0345  NA 134* 137  --   --   K 3.4* 3.0* 2.9* 3.5  CL 104 101  --   --   CO2 25 29  --   --   GLUCOSE 178* 124*  --   --   BUN 18 13  --   --   CREATININE 0.51* 0.39*  --   --   CALCIUM 9.4 9.2  --   --   MG 1.9 1.7  --   --   PHOS 3.3 3.7  --   --     ABG: Recent Labs  Lab 08/06/18 0050  PHART 7.433  PCO2ART 40.5  PO2ART 73.6*  HCO3 26.6  O2SAT 94.8    Liver Function Tests: No results for input(s): AST, ALT, ALKPHOS, BILITOT, PROT, ALBUMIN in the last 168 hours. No results for input(s): LIPASE, AMYLASE in the last 168 hours. No results for input(s): AMMONIA in the last 168 hours.  CBC: Recent Labs  Lab 08/05/18 0432 08/08/18 0729  WBC 10.2 13.3*  HGB 8.3* 8.4*  HCT  24.9* 24.9*  MCV 102.5* 101.6*  PLT 90* 115*    Cardiac Enzymes: No results for input(s): CKTOTAL, CKMB, CKMBINDEX, TROPONINI in the last 168 hours.  BNP (last 3 results) No results for input(s): BNP in the last 8760 hours.  ProBNP (last 3 results) No results for input(s): PROBNP in the last 8760 hours.  Radiological Exams: No results found.  Assessment/Plan Active Problems:   Acute on chronic respiratory failure with hypoxia (HCC)   Bilateral pneumonia   Non-traumatic rhabdomyolysis   Polysubstance abuse (HCC)   Acute tubular necrosis (HCC)   Severe sepsis (HCC)   Aspiration pneumonia (HCC)   1. Acute on chronic respiratory failure with hypoxia continue with oxygen therapy and supportive care as needed. 2. Bilateral pneumonia treated clinically improving 3. Nontraumatic rhabdomyolysis improved 4. Polysubstance abuse at baseline 5. ATN resolved continue to monitor 6. Severe sepsis resolved 7. Aspiration pneumonia treated continue to monitor due to most recent episode of aspiration.   I have personally seen and evaluated the patient, evaluated laboratory and imaging results, formulated the assessment and plan and placed orders. The Patient requires high complexity decision making for assessment and  support.  Case was discussed on Rounds with the Respiratory Therapy Staff  Allyne Gee, MD [facility] Pulmonary Critical Care Medicine Sleep Medicine

## 2018-08-11 DIAGNOSIS — J690 Pneumonitis due to inhalation of food and vomit: Secondary | ICD-10-CM | POA: Diagnosis not present

## 2018-08-11 DIAGNOSIS — J9621 Acute and chronic respiratory failure with hypoxia: Secondary | ICD-10-CM | POA: Diagnosis not present

## 2018-08-11 DIAGNOSIS — E871 Hypo-osmolality and hyponatremia: Secondary | ICD-10-CM | POA: Diagnosis not present

## 2018-08-11 DIAGNOSIS — J9600 Acute respiratory failure, unspecified whether with hypoxia or hypercapnia: Secondary | ICD-10-CM | POA: Diagnosis not present

## 2018-08-11 DIAGNOSIS — N170 Acute kidney failure with tubular necrosis: Secondary | ICD-10-CM | POA: Diagnosis not present

## 2018-08-11 DIAGNOSIS — F1910 Other psychoactive substance abuse, uncomplicated: Secondary | ICD-10-CM | POA: Diagnosis not present

## 2018-08-11 DIAGNOSIS — J189 Pneumonia, unspecified organism: Secondary | ICD-10-CM | POA: Diagnosis not present

## 2018-08-11 DIAGNOSIS — N179 Acute kidney failure, unspecified: Secondary | ICD-10-CM | POA: Diagnosis not present

## 2018-08-11 LAB — MAGNESIUM: Magnesium: 1.6 mg/dL — ABNORMAL LOW (ref 1.7–2.4)

## 2018-08-11 LAB — PHOSPHORUS: Phosphorus: 4 mg/dL (ref 2.5–4.6)

## 2018-08-11 LAB — TRIGLYCERIDES: Triglycerides: 82 mg/dL (ref <150)

## 2018-08-11 NOTE — Progress Notes (Addendum)
Pulmonary Critical Care Medicine [facility] [place]   PULMONARY CRITICAL CARE SERVICE  PROGRESS NOTE  Date of Service: 08/11/2018  Mark Patterson  CLE:[id number]  DOB: 29-May-1965   DOA: 07/16/2018  Referring Physician: Carron Curie, MD  HPI: Mark Patterson is a 53 y.o. male seen for follow up of Acute on Chronic Respiratory Failure.  Patient remains on 2 L of oxygen via nasal cannula at this time.  No acute stress noted.  Medications: Reviewed on Rounds  Physical Exam:  Vitals: Pulse 76 respirations 20 BP 153/84 O2 sat 91% temp 98.2  Ventilator Settings not currently on ventilator  . General: Comfortable at this time . Eyes: Grossly normal lids, irises & conjunctiva . ENT: grossly tongue is normal . Neck: no obvious mass . Cardiovascular: S1 S2 normal no gallop . Respiratory: Rales or rhonchi noted . Abdomen: soft . Skin: no rash seen on limited exam . Musculoskeletal: not rigid . Psychiatric:unable to assess . Neurologic: no seizure no involuntary movements         Lab Data:   Basic Metabolic Panel: Recent Labs  Lab 08/05/18 0432 08/08/18 0729 08/09/18 0408 08/10/18 0345 08/11/18 0731  NA 134* 137  --   --   --   K 3.4* 3.0* 2.9* 3.5  --   CL 104 101  --   --   --   CO2 25 29  --   --   --   GLUCOSE 178* 124*  --   --   --   BUN 18 13  --   --   --   CREATININE 0.51* 0.39*  --   --   --   CALCIUM 9.4 9.2  --   --   --   MG 1.9 1.7  --   --  1.6*  PHOS 3.3 3.7  --   --  4.0    ABG: Recent Labs  Lab 08/06/18 0050  PHART 7.433  PCO2ART 40.5  PO2ART 73.6*  HCO3 26.6  O2SAT 94.8    Liver Function Tests: No results for input(s): AST, ALT, ALKPHOS, BILITOT, PROT, ALBUMIN in the last 168 hours. No results for input(s): LIPASE, AMYLASE in the last 168 hours. No results for input(s): AMMONIA in the last 168 hours.  CBC: Recent Labs  Lab 08/05/18 0432 08/08/18 0729  WBC 10.2 13.3*  HGB 8.3* 8.4*  HCT 24.9* 24.9*  MCV 102.5* 101.6*  PLT  90* 115*    Cardiac Enzymes: No results for input(s): CKTOTAL, CKMB, CKMBINDEX, TROPONINI in the last 168 hours.  BNP (last 3 results) No results for input(s): BNP in the last 8760 hours.  ProBNP (last 3 results) No results for input(s): PROBNP in the last 8760 hours.  Radiological Exams: No results found.  Assessment/Plan Active Problems:   Acute on chronic respiratory failure with hypoxia (HCC)   Bilateral pneumonia   Non-traumatic rhabdomyolysis   Polysubstance abuse (HCC)   Acute tubular necrosis (HCC)   Severe sepsis (HCC)   Aspiration pneumonia (HCC)   1. Acute on chronic respiratory failure hypoxia continue with oxygen therapy and supportive measures. 2. Bilateral pneumonia treated clinically improving 3. Nontraumatic Abdo myolysis improved 4. Substance abuse at baseline 5. ATN resolved continue to monitor 6. Severe sepsis resolved 7. aspiration pneumonia treated continue to monitor   I have personally seen and evaluated the patient, evaluated laboratory and imaging results, formulated the assessment and plan and placed orders. The Patient requires high complexity decision making for assessment and support.  Case was discussed on Rounds with the Respiratory Therapy Staff  Allyne Gee, MD [facility] Pulmonary Critical Care Medicine Sleep Medicine

## 2018-08-12 ENCOUNTER — Other Ambulatory Visit (HOSPITAL_COMMUNITY): Payer: Medicare Other

## 2018-08-12 DIAGNOSIS — F1910 Other psychoactive substance abuse, uncomplicated: Secondary | ICD-10-CM | POA: Diagnosis not present

## 2018-08-12 DIAGNOSIS — E876 Hypokalemia: Secondary | ICD-10-CM | POA: Diagnosis not present

## 2018-08-12 DIAGNOSIS — J690 Pneumonitis due to inhalation of food and vomit: Secondary | ICD-10-CM | POA: Diagnosis not present

## 2018-08-12 DIAGNOSIS — J189 Pneumonia, unspecified organism: Secondary | ICD-10-CM | POA: Diagnosis not present

## 2018-08-12 DIAGNOSIS — N170 Acute kidney failure with tubular necrosis: Secondary | ICD-10-CM | POA: Diagnosis not present

## 2018-08-12 DIAGNOSIS — J9600 Acute respiratory failure, unspecified whether with hypoxia or hypercapnia: Secondary | ICD-10-CM | POA: Diagnosis not present

## 2018-08-12 DIAGNOSIS — J9621 Acute and chronic respiratory failure with hypoxia: Secondary | ICD-10-CM | POA: Diagnosis not present

## 2018-08-12 LAB — CBC
HCT: 25.9 % — ABNORMAL LOW (ref 39.0–52.0)
Hemoglobin: 8.7 g/dL — ABNORMAL LOW (ref 13.0–17.0)
MCH: 33.9 pg (ref 26.0–34.0)
MCHC: 33.6 g/dL (ref 30.0–36.0)
MCV: 100.8 fL — ABNORMAL HIGH (ref 80.0–100.0)
Platelets: 180 10*3/uL (ref 150–400)
RBC: 2.57 MIL/uL — ABNORMAL LOW (ref 4.22–5.81)
RDW: 14.6 % (ref 11.5–15.5)
WBC: 9.6 10*3/uL (ref 4.0–10.5)
nRBC: 0 % (ref 0.0–0.2)

## 2018-08-12 NOTE — Progress Notes (Addendum)
Pulmonary Critical Care Medicine [facility] [place]   PULMONARY CRITICAL CARE SERVICE  PROGRESS NOTE  Date of Service: 08/12/2018  Mark Patterson  [id number]  DOB: [birth year]   DOA: 07/16/2018  Referring Physician: Carron Curie, MD  HPI: Mark Patterson is a 53 y.o. male seen for follow up of Acute on Chronic Respiratory Failure.  Patient remains on 2 L of oxygen via nasal cannula.  No acute distress noted at this time.  Medications: Reviewed on Rounds  Physical Exam:  Vitals: Pulse 88 respirations 15 BP 139/89 O2 sat 95% temp 96.1  Ventilator Settings not currently on ventilator  . General: Comfortable at this time . Eyes: Grossly normal lids, irises & conjunctiva . ENT: grossly tongue is normal . Neck: no obvious mass . Cardiovascular: S1 S2 normal no gallop . Respiratory: No rales or rhonchi noted . Abdomen: soft . Skin: no rash seen on limited exam . Musculoskeletal: not rigid . Psychiatric:unable to assess . Neurologic: no seizure no involuntary movements         Lab Data:   Basic Metabolic Panel: Recent Labs  Lab 08/08/18 0729 08/09/18 0408 08/10/18 0345 08/11/18 0731 08/12/18 0621  NA 137  --   --   --  136  K 3.0* 2.9* 3.5  --  2.3*  CL 101  --   --   --  99  CO2 29  --   --   --  27  GLUCOSE 124*  --   --   --  112*  BUN 13  --   --   --  5*  CREATININE 0.39*  --   --   --  0.47*  CALCIUM 9.2  --   --   --  8.6*  MG 1.7  --   --  1.6*  --   PHOS 3.7  --   --  4.0  --     ABG: Recent Labs  Lab 08/06/18 0050  PHART 7.433  PCO2ART 40.5  PO2ART 73.6*  HCO3 26.6  O2SAT 94.8    Liver Function Tests: No results for input(s): AST, ALT, ALKPHOS, BILITOT, PROT, ALBUMIN in the last 168 hours. No results for input(s): LIPASE, AMYLASE in the last 168 hours. No results for input(s): AMMONIA in the last 168 hours.  CBC: Recent Labs  Lab 08/08/18 0729 08/12/18 0621  WBC 13.3* 9.6  HGB 8.4* 8.7*  HCT 24.9* 25.9*  MCV 101.6* 100.8*   PLT 115* 180    Cardiac Enzymes: No results for input(s): CKTOTAL, CKMB, CKMBINDEX, TROPONINI in the last 168 hours.  BNP (last 3 results) No results for input(s): BNP in the last 8760 hours.  ProBNP (last 3 results) No results for input(s): PROBNP in the last 8760 hours.  Radiological Exams: Dg Chest Port 1 View  Result Date: 08/12/2018 CLINICAL DATA:  Pneumonia EXAM: PORTABLE CHEST 1 VIEW COMPARISON:  08/04/2018 FINDINGS: Significant increase in bilateral lung opacity with hazy appearance that is symmetric. Likely normal heart size. Negative mediastinal contours. No pneumothorax. IMPRESSION: Significant, symmetric increase in bilateral chest opacification attributed to airspace disease and layering pleural fluid. Electronically Signed   By: Marnee Spring M.D.   On: 08/12/2018 07:12    Assessment/Plan Active Problems:   Acute on chronic respiratory failure with hypoxia (HCC)   Bilateral pneumonia   Non-traumatic rhabdomyolysis   Polysubstance abuse (HCC)   Acute tubular necrosis (HCC)   Severe sepsis (HCC)   Aspiration pneumonia (HCC)   1. Acute on chronic  respiratory failure with hypoxia continue with oxygen therapy and supportive measures. 2. Bilateral pneumonia treated clinically improving 3. Nontraumatic rhabdomyolysis improved 4. Substance abuse at baseline 5. ATN resolved continue to monitor 6. Severe sepsis resolved 7. Aspiration pneumonia treated continue to monitor   I have personally seen and evaluated the patient, evaluated laboratory and imaging results, formulated the assessment and plan and placed orders. The Patient requires high complexity decision making for assessment and support.  Case was discussed on Rounds with the Respiratory Therapy Staff  Yevonne Pax, MD [facility] Pulmonary Critical Care Medicine Sleep Medicine

## 2018-08-13 DIAGNOSIS — J690 Pneumonitis due to inhalation of food and vomit: Secondary | ICD-10-CM | POA: Diagnosis not present

## 2018-08-13 DIAGNOSIS — N170 Acute kidney failure with tubular necrosis: Secondary | ICD-10-CM | POA: Diagnosis not present

## 2018-08-13 DIAGNOSIS — J9600 Acute respiratory failure, unspecified whether with hypoxia or hypercapnia: Secondary | ICD-10-CM | POA: Diagnosis not present

## 2018-08-13 DIAGNOSIS — F1910 Other psychoactive substance abuse, uncomplicated: Secondary | ICD-10-CM | POA: Diagnosis not present

## 2018-08-13 DIAGNOSIS — J9621 Acute and chronic respiratory failure with hypoxia: Secondary | ICD-10-CM | POA: Diagnosis not present

## 2018-08-13 DIAGNOSIS — E876 Hypokalemia: Secondary | ICD-10-CM | POA: Diagnosis not present

## 2018-08-13 DIAGNOSIS — R1310 Dysphagia, unspecified: Secondary | ICD-10-CM | POA: Diagnosis not present

## 2018-08-13 LAB — BASIC METABOLIC PANEL
Anion gap: 10 (ref 5–15)
Anion gap: 11 (ref 5–15)
BUN: 5 mg/dL — ABNORMAL LOW (ref 6–20)
BUN: <5 mg/dL — ABNORMAL LOW (ref 6–20)
CO2: 27 mmol/L (ref 22–32)
CO2: 25 mmol/L (ref 22–32)
Calcium: 8.6 mg/dL — ABNORMAL LOW (ref 8.9–10.3)
Calcium: 8.9 mg/dL (ref 8.9–10.3)
Chloride: 99 mmol/L (ref 98–111)
Chloride: 102 mmol/L (ref 98–111)
Creatinine, Ser: 0.47 mg/dL — ABNORMAL LOW (ref 0.61–1.24)
Creatinine, Ser: 0.49 mg/dL — ABNORMAL LOW (ref 0.61–1.24)
GFR calc Af Amer: >60 mL/min (ref >60)
GFR calc Af Amer: >60 mL/min (ref >60)
GFR calc non Af Amer: >60 mL/min (ref >60)
GFR calc non Af Amer: >60 mL/min (ref >60)
Glucose, Bld: 112 mg/dL — ABNORMAL HIGH (ref 70–99)
Glucose, Bld: 112 mg/dL — ABNORMAL HIGH (ref 70–99)
Potassium: 2.3 mmol/L — CL (ref 3.5–5.1)
Potassium: 3 mmol/L — ABNORMAL LOW (ref 3.5–5.1)
Sodium: 136 mmol/L (ref 135–145)
Sodium: 138 mmol/L (ref 135–145)

## 2018-08-13 LAB — MAGNESIUM: Magnesium: 1.9 mg/dL (ref 1.7–2.4)

## 2018-08-13 NOTE — Progress Notes (Addendum)
Pulmonary Critical Care Medicine [facility] [place]   PULMONARY CRITICAL CARE SERVICE  PROGRESS NOTE  Date of Service: 08/13/2018  Mark Patterson  [id number]  DOB: 05-24-1965   DOA: 07/16/2018  Referring Physician: Carron Curie, MD  HPI: Mark Patterson is a 53 y.o. male seen for follow up of Acute on Chronic Respiratory Failure.  Patient remains on 2 L of nasal cannula.  He continues to await PEG placement.  No acute distress noted at this time.  Medications: Reviewed on Rounds  Physical Exam:  Vitals: Pulse 94 respirations 22 BP 157/110 O2 sat 93% temp 96.2  Ventilator Settings not currently on ventilator  . General: Comfortable at this time . Eyes: Grossly normal lids, irises & conjunctiva . ENT: grossly tongue is normal . Neck: no obvious mass . Cardiovascular: S1 S2 normal no gallop . Respiratory: No rales or rhonchi noted . Abdomen: soft . Skin: no rash seen on limited exam . Musculoskeletal: not rigid . Psychiatric:unable to assess . Neurologic: no seizure no involuntary movements         Lab Data:   Basic Metabolic Panel: Recent Labs  Lab 08/08/18 0729 08/09/18 0408 08/10/18 0345 08/11/18 0731 08/12/18 0621 08/13/18 0557  NA 137  --   --   --  136 138  K 3.0* 2.9* 3.5  --  2.3* 3.0*  CL 101  --   --   --  99 102  CO2 29  --   --   --  27 25  GLUCOSE 124*  --   --   --  112* 112*  BUN 13  --   --   --  5* <5*  CREATININE 0.39*  --   --   --  0.47* 0.49*  CALCIUM 9.2  --   --   --  8.6* 8.9  MG 1.7  --   --  1.6*  --  1.9  PHOS 3.7  --   --  4.0  --   --     ABG: No results for input(s): PHART, PCO2ART, PO2ART, HCO3, O2SAT in the last 168 hours.  Liver Function Tests: No results for input(s): AST, ALT, ALKPHOS, BILITOT, PROT, ALBUMIN in the last 168 hours. No results for input(s): LIPASE, AMYLASE in the last 168 hours. No results for input(s): AMMONIA in the last 168 hours.  CBC: Recent Labs  Lab 08/08/18 0729 08/12/18 0621  WBC  13.3* 9.6  HGB 8.4* 8.7*  HCT 24.9* 25.9*  MCV 101.6* 100.8*  PLT 115* 180    Cardiac Enzymes: No results for input(s): CKTOTAL, CKMB, CKMBINDEX, TROPONINI in the last 168 hours.  BNP (last 3 results) No results for input(s): BNP in the last 8760 hours.  ProBNP (last 3 results) No results for input(s): PROBNP in the last 8760 hours.  Radiological Exams: Dg Chest Port 1 View  Result Date: 08/12/2018 CLINICAL DATA:  Pneumonia EXAM: PORTABLE CHEST 1 VIEW COMPARISON:  08/04/2018 FINDINGS: Significant increase in bilateral lung opacity with hazy appearance that is symmetric. Likely normal heart size. Negative mediastinal contours. No pneumothorax. IMPRESSION: Significant, symmetric increase in bilateral chest opacification attributed to airspace disease and layering pleural fluid. Electronically Signed   By: Marnee Spring M.D.   On: 08/12/2018 07:12    Assessment/Plan Active Problems:   Acute on chronic respiratory failure with hypoxia (HCC)   Bilateral pneumonia   Non-traumatic rhabdomyolysis   Polysubstance abuse (HCC)   Acute tubular necrosis (HCC)   Severe sepsis (HCC)  Aspiration pneumonia (HCC)   1. Acute on chronic respiratory failure with hypoxia continue oxygen therapy and supportive measures at this time. 2. Bilateral pneumonia treated clinically improving 3. Nontraumatic rhabdomyolysis improved 4. Substance abuse at baseline 5. ATN resolved continue to monitor 6. Severe sepsis resolved 7. Aspiration pneumonia treated continue to monitor   I have personally seen and evaluated the patient, evaluated laboratory and imaging results, formulated the assessment and plan and placed orders. The Patient requires high complexity decision making for assessment and support.  Case was discussed on Rounds with the Respiratory Therapy Staff  Yevonne PaxSaadat A Hilde Churchman, MD [facility] Pulmonary Critical Care Medicine Sleep Medicine

## 2018-08-14 DIAGNOSIS — E876 Hypokalemia: Secondary | ICD-10-CM | POA: Diagnosis not present

## 2018-08-14 DIAGNOSIS — N170 Acute kidney failure with tubular necrosis: Secondary | ICD-10-CM | POA: Diagnosis not present

## 2018-08-14 DIAGNOSIS — J690 Pneumonitis due to inhalation of food and vomit: Secondary | ICD-10-CM | POA: Diagnosis not present

## 2018-08-14 DIAGNOSIS — J9621 Acute and chronic respiratory failure with hypoxia: Secondary | ICD-10-CM | POA: Diagnosis not present

## 2018-08-14 DIAGNOSIS — F1910 Other psychoactive substance abuse, uncomplicated: Secondary | ICD-10-CM | POA: Diagnosis not present

## 2018-08-14 DIAGNOSIS — R1310 Dysphagia, unspecified: Secondary | ICD-10-CM | POA: Diagnosis not present

## 2018-08-14 DIAGNOSIS — J9600 Acute respiratory failure, unspecified whether with hypoxia or hypercapnia: Secondary | ICD-10-CM | POA: Diagnosis not present

## 2018-08-14 LAB — PHOSPHORUS: Phosphorus: 4.3 mg/dL (ref 2.5–4.6)

## 2018-08-14 LAB — TRIGLYCERIDES: Triglycerides: 78 mg/dL (ref <150)

## 2018-08-14 LAB — POTASSIUM: Potassium: 3.1 mmol/L — ABNORMAL LOW (ref 3.5–5.1)

## 2018-08-14 LAB — MAGNESIUM: Magnesium: 1.7 mg/dL (ref 1.7–2.4)

## 2018-08-14 NOTE — Progress Notes (Addendum)
Pulmonary Critical Care Medicine [facility] [place]   PULMONARY CRITICAL CARE SERVICE  PROGRESS NOTE  Date of Service: 08/14/2018  Mark Patterson  VEL:[id number]  DOB: Oct 19, 1965   DOA: 07/16/2018  Referring Physician: Carron Curie, MD  HPI: Mark Patterson is a 53 y.o. male seen for follow up of Acute on Chronic Respiratory Failure.  Patient is on 2 L of oxygen via nasal cannula at this time.  No distress noted patient is afebrile  Medications: Reviewed on Rounds  Physical Exam:  Vitals: Pulse 84 respirations 19 BP 135/94 O2 sat percent temp 98.6  Ventilator Settings not currently on ventilator  . General: Comfortable at this time . Eyes: Grossly normal lids, irises & conjunctiva . ENT: grossly tongue is normal . Neck: no obvious mass . Cardiovascular: S1 S2 normal no gallop . Respiratory: No rales or rhonchi noted . Abdomen: soft . Skin: no rash seen on limited exam . Musculoskeletal: not rigid . Psychiatric:unable to assess . Neurologic: no seizure no involuntary movements         Lab Data:   Basic Metabolic Panel: Recent Labs  Lab 08/08/18 0729 08/09/18 0408 08/10/18 0345 08/11/18 0731 08/12/18 0621 08/13/18 0557 08/14/18 0614  NA 137  --   --   --  136 138  --   K 3.0* 2.9* 3.5  --  2.3* 3.0* 3.1*  CL 101  --   --   --  99 102  --   CO2 29  --   --   --  27 25  --   GLUCOSE 124*  --   --   --  112* 112*  --   BUN 13  --   --   --  5* <5*  --   CREATININE 0.39*  --   --   --  0.47* 0.49*  --   CALCIUM 9.2  --   --   --  8.6* 8.9  --   MG 1.7  --   --  1.6*  --  1.9 1.7  PHOS 3.7  --   --  4.0  --   --  4.3    ABG: No results for input(s): PHART, PCO2ART, PO2ART, HCO3, O2SAT in the last 168 hours.  Liver Function Tests: No results for input(s): AST, ALT, ALKPHOS, BILITOT, PROT, ALBUMIN in the last 168 hours. No results for input(s): LIPASE, AMYLASE in the last 168 hours. No results for input(s): AMMONIA in the last 168 hours.  CBC: Recent  Labs  Lab 08/08/18 0729 08/12/18 0621  WBC 13.3* 9.6  HGB 8.4* 8.7*  HCT 24.9* 25.9*  MCV 101.6* 100.8*  PLT 115* 180    Cardiac Enzymes: No results for input(s): CKTOTAL, CKMB, CKMBINDEX, TROPONINI in the last 168 hours.  BNP (last 3 results) No results for input(s): BNP in the last 8760 hours.  ProBNP (last 3 results) No results for input(s): PROBNP in the last 8760 hours.  Radiological Exams: No results found.  Assessment/Plan Active Problems:   Acute on chronic respiratory failure with hypoxia (HCC)   Bilateral pneumonia   Non-traumatic rhabdomyolysis   Polysubstance abuse (HCC)   Acute tubular necrosis (HCC)   Severe sepsis (HCC)   Aspiration pneumonia (HCC)   1. Acute on chronic respiratory failure with hypoxia continue ox therapy and supportive measures at this time. 2. Bilateral pneumonia treated clinically 3. Nontraumatic rhabdomyolysis improved 4. Substance abuse at baseline 5. ATN resolved continue to monitor 6. Severe sepsis resolved 7. Aspiration  pneumonia treated continue Dr.   I have personally seen and evaluated the patient, evaluated laboratory and imaging results, formulated the assessment and plan and placed orders. The Patient requires high complexity decision making for assessment and support.  Case was discussed on Rounds with the Respiratory Therapy Staff  Yevonne PaxSaadat A Khan, MD [facility] Pulmonary Critical Care Medicine Sleep Medicine

## 2018-08-15 ENCOUNTER — Other Ambulatory Visit (HOSPITAL_COMMUNITY): Payer: Medicare Other

## 2018-08-15 ENCOUNTER — Encounter (HOSPITAL_COMMUNITY): Payer: Self-pay | Admitting: Interventional Radiology

## 2018-08-15 DIAGNOSIS — R1310 Dysphagia, unspecified: Secondary | ICD-10-CM | POA: Diagnosis not present

## 2018-08-15 DIAGNOSIS — J690 Pneumonitis due to inhalation of food and vomit: Secondary | ICD-10-CM | POA: Diagnosis not present

## 2018-08-15 DIAGNOSIS — E876 Hypokalemia: Secondary | ICD-10-CM | POA: Diagnosis not present

## 2018-08-15 DIAGNOSIS — N170 Acute kidney failure with tubular necrosis: Secondary | ICD-10-CM | POA: Diagnosis not present

## 2018-08-15 DIAGNOSIS — J9621 Acute and chronic respiratory failure with hypoxia: Secondary | ICD-10-CM | POA: Diagnosis not present

## 2018-08-15 DIAGNOSIS — E43 Unspecified severe protein-calorie malnutrition: Secondary | ICD-10-CM | POA: Diagnosis not present

## 2018-08-15 DIAGNOSIS — J9600 Acute respiratory failure, unspecified whether with hypoxia or hypercapnia: Secondary | ICD-10-CM | POA: Diagnosis not present

## 2018-08-15 DIAGNOSIS — F1910 Other psychoactive substance abuse, uncomplicated: Secondary | ICD-10-CM | POA: Diagnosis not present

## 2018-08-15 HISTORY — PX: IR GASTROSTOMY TUBE MOD SED: IMG625

## 2018-08-15 LAB — PROTIME-INR
INR: 1.2 (ref 0.8–1.2)
Prothrombin Time: 15.3 seconds — ABNORMAL HIGH (ref 11.4–15.2)

## 2018-08-15 MED ORDER — MIDAZOLAM HCL 2 MG/2ML IJ SOLN
INTRAMUSCULAR | Status: AC
  Filled 2018-08-15: qty 2

## 2018-08-15 MED ORDER — FENTANYL CITRATE (PF) 100 MCG/2ML IJ SOLN
INTRAMUSCULAR | Status: AC | PRN
  Administered 2018-08-15 (×2): 25 ug via INTRAVENOUS

## 2018-08-15 MED ORDER — HYDROMORPHONE HCL 1 MG/ML IJ SOLN: 1.0000 mg | INTRAMUSCULAR | Status: DC | PRN

## 2018-08-15 MED ORDER — CEFAZOLIN SODIUM-DEXTROSE 1-4 GM/50ML-% IV SOLN
INTRAVENOUS | Status: AC | PRN
  Administered 2018-08-15: 2 g via INTRAVENOUS

## 2018-08-15 MED ORDER — IOHEXOL 300 MG/ML  SOLN
50.0000 mL | Freq: Once | INTRAMUSCULAR | Status: AC | PRN
  Administered 2018-08-15: 10:00:00 20 mL

## 2018-08-15 MED ORDER — ONDANSETRON HCL 4 MG/2ML IJ SOLN: 4.0000 mg | INTRAMUSCULAR | Status: DC | PRN

## 2018-08-15 MED ORDER — CEFAZOLIN SODIUM-DEXTROSE 2-4 GM/100ML-% IV SOLN
INTRAVENOUS | Status: AC
  Filled 2018-08-15: qty 100

## 2018-08-15 MED ORDER — FENTANYL CITRATE (PF) 100 MCG/2ML IJ SOLN
INTRAMUSCULAR | Status: AC
  Filled 2018-08-15: qty 2

## 2018-08-15 MED ORDER — HYDROCODONE-ACETAMINOPHEN 5-325 MG PO TABS: 1.0000 | ORAL_TABLET | ORAL | Status: DC | PRN

## 2018-08-15 MED ORDER — GLUCAGON HCL RDNA (DIAGNOSTIC) 1 MG IJ SOLR
INTRAMUSCULAR | Status: AC | PRN
  Administered 2018-08-15: .5 mg via INTRAVENOUS

## 2018-08-15 MED ORDER — GLUCAGON HCL RDNA (DIAGNOSTIC) 1 MG IJ SOLR
INTRAMUSCULAR | Status: AC
  Filled 2018-08-15: qty 1

## 2018-08-15 MED ORDER — LIDOCAINE HCL 1 % IJ SOLN
INTRAMUSCULAR | Status: AC
  Filled 2018-08-15: qty 20

## 2018-08-15 MED ORDER — LIDOCAINE HCL 1 % IJ SOLN
INTRAMUSCULAR | Status: AC | PRN
  Administered 2018-08-15: 5 mL

## 2018-08-15 MED ORDER — CEFAZOLIN SODIUM-DEXTROSE 2-4 GM/100ML-% IV SOLN: 2.0000 g | Freq: Once | INTRAVENOUS | Status: DC

## 2018-08-15 MED ORDER — MIDAZOLAM HCL 2 MG/2ML IJ SOLN
INTRAMUSCULAR | Status: AC | PRN
  Administered 2018-08-15: 1 mg via INTRAVENOUS

## 2018-08-15 NOTE — Progress Notes (Addendum)
[facility] [place]   PULMONARY CRITICAL CARE SERVICE  PROGRESS NOTE  Date of Service: 08/15/2018  Mark Patterson  ZOX:[id number]  DOB: 05-12-1965   DOA: 07/16/2018  Referring Physician: Carron CurieAli Hijazi, MD  HPI: Mark Patterson is a 53 y.o. male seen for follow up of Acute on Chronic Respiratory Failure.  Patient remains on 2 L of oxygen via nasal cannula.  No acute distress noted at this time.  Medications: Reviewed on Rounds  Physical Exam:  Vitals: Pulse 80 respirations 18 BP 159/92 O2 sat 97% temp 97 8  Ventilator Settings not currently on ventilator  . General: Comfortable at this time . Eyes: Grossly normal lids, irises & conjunctiva . ENT: grossly tongue is normal . Neck: no obvious mass . Cardiovascular: S1 S2 normal no gallop . Respiratory: No rales or rhonchi noted . Abdomen: soft . Skin: no rash seen on limited exam . Musculoskeletal: not rigid . Psychiatric:unable to assess . Neurologic: no seizure no involuntary movements         Lab Data:   Basic Metabolic Panel: Recent Labs  Lab 08/09/18 0408 08/10/18 0345 08/11/18 0731 08/12/18 0621 08/13/18 0557 08/14/18 0614  NA  --   --   --  136 138  --   K 2.9* 3.5  --  2.3* 3.0* 3.1*  CL  --   --   --  99 102  --   CO2  --   --   --  27 25  --   GLUCOSE  --   --   --  112* 112*  --   BUN  --   --   --  5* <5*  --   CREATININE  --   --   --  0.47* 0.49*  --   CALCIUM  --   --   --  8.6* 8.9  --   MG  --   --  1.6*  --  1.9 1.7  PHOS  --   --  4.0  --   --  4.3    ABG: No results for input(s): PHART, PCO2ART, PO2ART, HCO3, O2SAT in the last 168 hours.  Liver Function Tests: No results for input(s): AST, ALT, ALKPHOS, BILITOT, PROT, ALBUMIN in the last 168 hours. No results for input(s): LIPASE, AMYLASE in the last 168 hours. No results for input(s): AMMONIA in the last 168 hours.  CBC: Recent Labs  Lab 08/12/18 0621  WBC 9.6  HGB 8.7*  HCT 25.9*  MCV  100.8*  PLT 180    Cardiac Enzymes: No results for input(s): CKTOTAL, CKMB, CKMBINDEX, TROPONINI in the last 168 hours.  BNP (last 3 results) No results for input(s): BNP in the last 8760 hours.  ProBNP (last 3 results) No results for input(s): PROBNP in the last 8760 hours.  Radiological Exams: Ir Gastrostomy Tube Mod Sed  Result Date: 08/15/2018 CLINICAL DATA:  Protein calorie malnutrition, needs enteral feeding support EXAM: PERC PLACEMENT GASTROSTOMY FLUOROSCOPY TIME:  11.3 minutes; 124 uGym2 DAP TECHNIQUE: The procedure, risks, benefits, and alternatives were explained to the patient's mother. Questions regarding the procedure were encouraged and answered. The mother understands and consents to the procedure. As antibiotic prophylaxis, cefazolin 2 g was ordered pre-procedure and administered intravenously within one hour of incision. A safe percutaneous approach was confirmed on recent CT. A 5 French angiographic catheter was placed as orogastric tube. The upper abdomen was prepped with Betadine, draped in usual sterile fashion, and infiltrated locally  with 1% lidocaine. Intravenous Fentanyl and Versed 1mg  were administered as conscious sedation during continuous monitoring of the patient's level of consciousness and physiological / cardiorespiratory status by the radiology RN, with a total moderate sedation time of 21 minutes. 0.5 mg glucagon given IV. Stomach was insufflated using air through the orogastric tube. An 47 French sheath needle was advanced percutaneously into the gastric lumen under fluoroscopy. Gas could be aspirated and a small contrast injection confirmed intraluminal spread. The sheath was exchanged over a guidewire for a 9 [place] vascular sheath, through which the snare device was advanced and used to snare a guidewire passed through the orogastric tube. This was withdrawn, and the snare attached to the 20 French pull-through gastrostomy tube, which was advanced  antegrade, positioned with the internal bumper securing the anterior gastric wall to the anterior abdominal wall. Small contrast injection confirms appropriate positioning. The external bumper was applied and the catheter was flushed. COMPLICATIONS: COMPLICATIONS none IMPRESSION: 1. Technically successful 20 French pull-through gastrostomy placement under fluoroscopy. Electronically Signed   By: Corlis Leak M.D.   On: 08/15/2018 12:55    Assessment/Plan Active Problems:   Acute on chronic respiratory failure with hypoxia (HCC)   Bilateral pneumonia   Non-traumatic rhabdomyolysis   Polysubstance abuse (HCC)   Acute tubular necrosis (HCC)   Severe sepsis (HCC)   Aspiration pneumonia (HCC)   1. Acute on chronic respiratory failure with hypoxia continue with oxygen therapy as well as supportive measures at this time. 2. Bilateral pneumonia treated clinically improved 3. Nontraumatic rhabdomyolysis improved 4. Substance abuse at baseline 5. ATN resolved continue to monitor 6. Severe sepsis resolved  7. aspiration pneumonia treated continue monitor   I have personally seen and evaluated the patient, evaluated laboratory and imaging results, formulated the assessment and plan and placed orders. The Patient requires high complexity decision making for assessment and support.  Case was discussed on Rounds with the Respiratory Therapy Staff  Yevonne Pax, MD [facility] Pulmonary Critical Care Medicine Sleep Medicine

## 2018-08-15 NOTE — Consult Note (Signed)
Chief Complaint: Patient was seen in consultation today for dysphasia and aspiration/gastrostomy tube placement.  Referring Physician(s): Camelia EngVarghese, Priya  Supervising Physician: Oley BalmHassell, Daniel  [facility]  History of Present Illness: Mark Patterson is a 53 y.o. male with a past medical history of acute on chronic respiratory failure, aspiration pneumonia, acute tubular necrosis, severe sepsis, non-traumatic rhabdomyolysis, and polysubstance abuse. He has been a patient at [facility] since 07/16/[facility]. He has dysphasia and aspirated, leading to aspiration pneumonia.  IR requested by Camelia EngPriya Varghese, NP for possible image-guided percutaneous gastrostomy tube placement. Patient awake and alert laying in bed with no complaints at this time.  Patient is currently receiving Lovenox SQ injections.   Past Medical History:  Diagnosis Date  . Acute on chronic respiratory failure with hypoxia (HCC)   . Acute tubular necrosis (HCC)   . Aspiration pneumonia (HCC)   . Bilateral pneumonia   . Non-traumatic rhabdomyolysis   . Polysubstance abuse (HCC)   . Severe sepsis (HCC)    Allergies: Patient has no allergy information on record.  Medications: Prior to Admission medications   Not on File     No family history on file.  Social History   Socioeconomic History  . Marital status: Single    Spouse name: Not on file  . Number of children: Not on file  . Years of education: Not on file  . Highest education level: Not on file  Occupational History  . Not on file  Social Needs  . Financial resource strain: Not on file  . Food insecurity:    Worry: Not on file    Inability: Not on file  . Transportation needs:    Medical: Not on file    Non-medical: Not on file  Tobacco Use  . Smoking status: Not on file  Substance and Sexual Activity  . Alcohol use: Not on file  . Drug use: Not on file  . Sexual activity: Not on file  Lifestyle  . Physical  activity:    Days per week: Not on file    Minutes per session: Not on file  . Stress: Not on file  Relationships  . Social connections:    Talks on phone: Not on file    Gets together: Not on file    Attends religious service: Not on file    Active member of club or organization: Not on file    Attends meetings of clubs or organizations: Not on file    Relationship status: Not on file  Other Topics Concern  . Not on file  Social History Narrative  . Not on file     Review of Systems: A 12 point ROS discussed and pertinent positives are indicated in the HPI above.  All other systems are negative.  Review of Systems  Constitutional: Negative for chills and fever.  Respiratory: Negative for shortness of breath and wheezing.   Cardiovascular: Negative for chest pain and palpitations.  Gastrointestinal: Negative for abdominal pain.  Neurological: Negative for headaches.  Psychiatric/Behavioral: Negative for behavioral problems and confusion.    Vital Signs: There were no vitals taken for this visit.  Physical Exam Vitals signs and nursing note reviewed.  Constitutional:      General: He is not in acute distress.    Appearance: Normal appearance.  Cardiovascular:     Rate and Rhythm: Normal rate and regular rhythm.     Heart sounds: Normal heart sounds. No murmur.  Pulmonary:  Effort: Pulmonary effort is normal. No respiratory distress.     Breath sounds: Normal breath sounds. No wheezing.  Skin:    General: Skin is warm and dry.  Neurological:     Mental Status: He is alert.      MD Evaluation Airway: WNL Heart: WNL Abdomen: WNL Chest/ Lungs: WNL ASA  Classification: 3 Mallampati/Airway Score: Two   Imaging: Ct Chest W Contrast  Result Date: 08/01/2018 CLINICAL DATA:  53 year old male with difficulty breathing EXAM: CT CHEST WITH CONTRAST TECHNIQUE: Multidetector CT imaging of the chest was performed during intravenous contrast administration. CONTRAST:   75mL OMNIPAQUE IOHEXOL 300 MG/ML  SOLN COMPARISON:  Multiple prior plain film most recent 07/31/2018 FINDINGS: Cardiovascular: Heart size within normal limits. No pericardial fluid/thickening. Calcifications of the left anterior descending coronary artery. Normal course caliber and contour of the thoracic aorta. No aneurysm. No significant atherosclerotic changes. Unremarkable caliber of the main pulmonary artery. The contrast bolus is not timed for a sensitive evaluation of pulmonary filling defects. Mediastinum/Nodes: Multiple mediastinal lymph nodes. Index lymph nodes present in the AP window measuring 11 mm. Unremarkable appearance of the thoracic inlet. Endotracheal tube terminates in the trachea above the carina. Unremarkable course of the thoracic esophagus. Lungs/Pleura: Debris within the right mainstem bronchus crossing the origin of the right upper lobe bronchus and into the bronchus intermedius. Debris into the right lower lobe bronchi. Pattern of centrilobular nodularity of the right lower lobe in a predominantly peribronchovascular distribution. More nodular opacities peripherally. No evidence of right-sided pleural effusion. Debris within the left lower lobe bronchus. Complete collapse of the left lower lobe with mixed low-density/intermediate density/perfusion of the tissue. Left upper lobe relatively well aerated. Mild centrilobular pattern of opacity of the left upper lobe. Centrilobular and paraseptal emphysema. Upper Abdomen: No acute finding of the upper abdomen. Musculoskeletal: No displaced fracture. Mild degenerative changes. Right upper extremity PICC. IMPRESSION: Multifocal pneumonia, most likely secondary to aspiration with debris in the right mainstem bronchus, right lower lobe bronchus, and the bronchi of the left lower lobe. Complete left lower lobe collapse, likely with debris/mucous plugging of the associated bronchi. Correlation with bronchoscopy may be useful. Endotracheal tube  terminates suitably above the carina. Single vessel coronary artery disease. Electronically Signed   By: Gilmer Mor D.O.   On: 08/01/2018 12:19   Dg Chest Port 1 View  Result Date: 08/12/2018 CLINICAL DATA:  Pneumonia EXAM: PORTABLE CHEST 1 VIEW COMPARISON:  08/04/2018 FINDINGS: Significant increase in bilateral lung opacity with hazy appearance that is symmetric. Likely normal heart size. Negative mediastinal contours. No pneumothorax. IMPRESSION: Significant, symmetric increase in bilateral chest opacification attributed to airspace disease and layering pleural fluid. Electronically Signed   By: Marnee Spring M.D.   On: 08/12/2018 07:12   Dg Chest Port 1 View  Result Date: 08/04/2018 CLINICAL DATA:  Respiratory failure EXAM: PORTABLE CHEST 1 VIEW COMPARISON:  CT 08/01/2018.  Plain film 07/31/2018. FINDINGS: Endotracheal tube terminates 4.5 cm above carina. Right-sided PICC line tip at mid SVC. Normal heart size. No pleural effusion or pneumothorax. Improved left-sided aeration. Left infrahilar and medial left lower lobe airspace disease remains. The right lung remains clear, with artifact projecting over the right suprahilar region. IMPRESSION: Improved left-sided airspace disease, likely aspiration or pneumonia. Electronically Signed   By: Jeronimo Greaves M.D.   On: 08/04/2018 13:21   Dg Chest Port 1 View  Result Date: 07/31/2018 CLINICAL DATA:  Intubation EXAM: PORTABLE CHEST 1 VIEW COMPARISON:  07/31/2018 FINDINGS: Endotracheal tube with  the tip 3 cm above the carina. Right-sided PICC line with the tip projecting over the SVC. Right lung is clear. Left lower lobe airspace disease and trace pleural effusion. No pneumothorax. Stable cardiomediastinal silhouette. IMPRESSION: 1. Endotracheal tube with the tip 3 cm above the carina. 2.  Left lower lobe airspace disease and trace pleural effusion. Electronically Signed   By: Elige Ko   On: 07/31/2018 18:27   Dg Chest Port 1 View  Result Date:  07/31/2018 CLINICAL DATA:  Patient nonresponsive.  Oxygen desaturation EXAM: PORTABLE CHEST 1 VIEW COMPARISON:  July 28, 2018 FINDINGS: Airspace consolidation is noted throughout much of the left lower lobe with volume loss. There is a small left pleural effusion. The right lung is mildly hyperexpanded but clear. Heart size and pulmonary vascularity are normal. No adenopathy. Central catheter tip is in the superior vena cava. No pneumothorax. Postoperative changes noted in the lower cervical spine. IMPRESSION: Airspace consolidation with volume loss left lower lobe. Question pneumonia versus aspiration. Small left pleural effusion. Right lung mildly hyperexpanded but clear. Heart size within normal limits. Central catheter tip in superior vena cava. No pneumothorax. Electronically Signed   By: Bretta Bang III M.D.   On: 07/31/2018 13:44   Dg Chest Port 1 View  Result Date: 07/28/2018 CLINICAL DATA:  PICC line placement EXAM: PORTABLE CHEST 1 VIEW COMPARISON:  07/27/2018 FINDINGS: Right PICC line tip overlies the mid SVC. Catheter could be advanced 6.5 cm for tip positioned at the SVC/RA junction, as warranted. Similar appearance of the bibasilar airspace disease. The cardiopericardial silhouette is within normal limits for size. The visualized bony structures of the thorax are intact. Telemetry leads overlie the chest. IMPRESSION: Right PICC line tip overlies the mid SVC level. Electronically Signed   By: Kennith Center M.D.   On: 07/28/2018 19:30   Dg Chest Port 1 View  Result Date: 07/27/2018 CLINICAL DATA:  Sudden onset of shortness of breath beginning this afternoon. EXAM: PORTABLE CHEST 1 VIEW COMPARISON:  One-view chest x-ray 07/19/2018 and 07/17/2018. FINDINGS: The heart size is normal. Previously noted left-sided airspace disease is cleared. There is residual right lower lobe airspace disease at has incompletely cleared. Moderate pulmonary vascular congestion is present bilaterally. No other  focal consolidation is present. Effusions have improved bilaterally. IMPRESSION: 1. Improving bilateral airspace disease with some residual right lower lobe airspace disease. 2. No new airspace disease. Electronically Signed   By: Marin Roberts M.D.   On: 07/27/2018 19:01   Dg Chest Port 1 View  Result Date: 07/19/2018 CLINICAL DATA:  Congestive heart failure. EXAM: PORTABLE CHEST 1 VIEW COMPARISON:  Single-view of the chest 07/17/2018. FINDINGS: Right IJ catheter is again seen. Left greater than right pleural effusions and airspace disease persist. Effusion and airspace disease on the right have worsened. Heart size is upper normal. No pneumothorax. IMPRESSION: Increased right pleural effusion and airspace disease. Larger left pleural effusion and more extensive airspace disease are unchanged. Findings could be due to edema and/or pneumonia. Electronically Signed   By: Drusilla Kanner M.D.   On: 07/19/2018 12:39   Dg Chest Port 1 View  Result Date: 07/17/2018 CLINICAL DATA:  Respiratory failure EXAM: PORTABLE CHEST 1 VIEW COMPARISON:  None. FINDINGS: Central catheter tip is in the superior vena cava. No pneumothorax. There is a left pleural effusion with airspace consolidation throughout much of the left lower lobe. The right lung is clear. Heart is upper normal in size with pulmonary vascularity normal. No adenopathy. There is  apparent deviation of lower cervical trachea toward the left. IMPRESSION: Left lower lobe airspace consolidation, likely pneumonia. Aspiration is a differential consideration. There is also left pleural effusion. The right lung appears clear. Heart upper normal in size. Central catheter tip in superior vena cava. Lower cervical tracheal deviation toward the left. Question enlarged thyroid. Electronically Signed   By: Bretta Bang III M.D.   On: 07/17/2018 07:11    Labs:  CBC: Recent Labs    08/01/18 0429 08/05/18 0432 08/08/18 0729 08/12/18 0621  WBC 15.3*  10.2 13.3* 9.6  HGB 9.0* 8.3* 8.4* 8.7*  HCT 27.0* 24.9* 24.9* 25.9*  PLT 129* 90* 115* 180    COAGS: Recent Labs    07/16/18 2315 08/15/18 0806  INR 1.3* 1.2    BMP: Recent Labs    08/05/18 0432 08/08/18 0729  08/10/18 0345 08/12/18 0621 08/13/18 0557 08/14/18 0614  NA 134* 137  --   --  136 138  --   K 3.4* 3.0*   < > 3.5 2.3* 3.0* 3.1*  CL 104 101  --   --  99 102  --   CO2 25 29  --   --  27 25  --   GLUCOSE 178* 124*  --   --  112* 112*  --   BUN 18 13  --   --  5* <5*  --   CALCIUM 9.4 9.2  --   --  8.6* 8.9  --   CREATININE 0.51* 0.39*  --   --  0.47* 0.49*  --   GFRNONAA >60 >60  --   --  >60 >60  --   GFRAA >60 >60  --   --  >60 >60  --    < > = values in this interval not displayed.    LIVER FUNCTION TESTS: Recent Labs    07/17/18 0522 07/18/18 0652 07/22/18 0713 07/27/18 0034  BILITOT 0.9 0.5 0.7 0.3  AST 30 30 36 30  ALT ALKPHOS 128* 106 101 99  PROT 5.3* 5.0* 5.7* 6.0*  ALBUMIN 2.4* 2.0* 2.1* 2.4*     Assessment and Plan:  Dysphagia. Aspiration. Plan for image-guided percutaneous gastrostomy tube placement today with Dr. Deanne Coffer. Patient is NPO. Afebrile and WBCs WNL. Lovenox held per IR protocol. INR 1.2 seconds today.  Consent obtained from patient's mother, Mark Patterson, via telephone.  Risks and benefits discussed with the patient including, but not limited to the need for a barium enema during the procedure, bleeding, infection, peritonitis, or damage to adjacent structures. All of the patient's questions were answered, patient is agreeable to proceed. Consent signed and in chart.   Thank you for this interesting consult.  I greatly enjoyed meeting Mark Patterson and look forward to participating in their care.  A copy of this report was sent to the requesting provider on this date.  Electronically Signed: Elwin Mocha, PA-C 08/15/2018, 9:27 AM   I spent a total of 40 Minutes in face to face in clinical  consultation, greater than 50% of which was counseling/coordinating care for dysphasia and aspiration/gastrsotomy tube placement.

## 2018-08-15 NOTE — Procedures (Signed)
  Procedure: Gastrostomy tube placement 20f EBL:   minimal Complications:  none immediate  See full dictation in [facility] PACS.  D. Zai Chmiel MD Main # [phone] Pager  [phone]    

## 2018-08-16 DIAGNOSIS — J690 Pneumonitis due to inhalation of food and vomit: Secondary | ICD-10-CM | POA: Diagnosis not present

## 2018-08-16 DIAGNOSIS — J9600 Acute respiratory failure, unspecified whether with hypoxia or hypercapnia: Secondary | ICD-10-CM | POA: Diagnosis not present

## 2018-08-16 DIAGNOSIS — E876 Hypokalemia: Secondary | ICD-10-CM | POA: Diagnosis not present

## 2018-08-16 DIAGNOSIS — F1910 Other psychoactive substance abuse, uncomplicated: Secondary | ICD-10-CM | POA: Diagnosis not present

## 2018-08-16 DIAGNOSIS — N170 Acute kidney failure with tubular necrosis: Secondary | ICD-10-CM | POA: Diagnosis not present

## 2018-08-16 DIAGNOSIS — R1310 Dysphagia, unspecified: Secondary | ICD-10-CM | POA: Diagnosis not present

## 2018-08-16 DIAGNOSIS — J9621 Acute and chronic respiratory failure with hypoxia: Secondary | ICD-10-CM | POA: Diagnosis not present

## 2018-08-16 LAB — POTASSIUM: Potassium: 4.3 mmol/L (ref 3.5–5.1)

## 2018-08-16 LAB — BASIC METABOLIC PANEL
Anion gap: 10 (ref 5–15)
BUN: <5 mg/dL — ABNORMAL LOW (ref 6–20)
CO2: 28 mmol/L (ref 22–32)
Calcium: 8.7 mg/dL — ABNORMAL LOW (ref 8.9–10.3)
Chloride: 98 mmol/L (ref 98–111)
Creatinine, Ser: 0.54 mg/dL — ABNORMAL LOW (ref 0.61–1.24)
GFR calc Af Amer: >60 mL/min (ref >60)
GFR calc non Af Amer: >60 mL/min (ref >60)
Glucose, Bld: 131 mg/dL — ABNORMAL HIGH (ref 70–99)
Potassium: 2.6 mmol/L — CL (ref 3.5–5.1)
Sodium: 136 mmol/L (ref 135–145)

## 2018-08-16 LAB — MAGNESIUM: Magnesium: 1.6 mg/dL — ABNORMAL LOW (ref 1.7–2.4)

## 2018-08-16 NOTE — Progress Notes (Signed)
IR round note via telephone due to new regulations. Blase Mess, RN.  Patient underwent an image-guided percutaneous gastrostomy tube placement 08/15/2018 by Dr. Deanne Coffer.  RN reports gastrostomy tube site c/d/i without erythema, active bleeding, or drainage.  Gave verbal order to RN to begin using gastrostomy tube. Please call IR with questions/concerns.  Waylan Boga Louk, PA-C 08/16/2018, 10:33 AM

## 2018-08-16 NOTE — Progress Notes (Addendum)
Pulmonary Critical Care Medicine [facility] [place]   PULMONARY CRITICAL CARE SERVICE  PROGRESS NOTE  Date of Service: 08/16/2018  Mark Patterson  CBS:[id number]  DOB: 12-Mar-1966   DOA: 07/16/2018  Referring Physician: Carron Curie, MD  HPI: Mark Patterson is a 53 y.o. male seen for follow up of Acute on Chronic Respiratory Failure.  Patient is on 2 L of oxygen via nasal cannula.  No distress at this time.  Medications: Reviewed on Rounds  Physical Exam:  Vitals: Pulse 62 respirations 10 BP 151/97 O2 sat 100% 97.6  Ventilator Settings not currently on ventilator  . General: Comfortable at this time . Eyes: Grossly normal lids, irises & conjunctiva . ENT: grossly tongue is normal . Neck: no obvious mass . Cardiovascular: S1 S2 normal no gallop . Respiratory: No rales or rhonchi noted . Abdomen: soft . Skin: no rash seen on limited exam . Musculoskeletal: not rigid . Psychiatric:unable to assess . Neurologic: no seizure no involuntary movements         Lab Data:   Basic Metabolic Panel: Recent Labs  Lab 08/10/18 0345 08/11/18 0731 08/12/18 0621 08/13/18 0557 08/14/18 0614 08/16/18 0538  NA  --   --  136 138  --  136  K 3.5  --  2.3* 3.0* 3.1* 2.6*  CL  --   --  99 102  --  98  CO2  --   --  27 25  --  28  GLUCOSE  --   --  112* 112*  --  131*  BUN  --   --  5* <5*  --  <5*  CREATININE  --   --  0.47* 0.49*  --  0.54*  CALCIUM  --   --  8.6* 8.9  --  8.7*  MG  --  1.6*  --  1.9 1.7 1.6*  PHOS  --  4.0  --   --  4.3  --     ABG: No results for input(s): PHART, PCO2ART, PO2ART, HCO3, O2SAT in the last 168 hours.  Liver Function Tests: No results for input(s): AST, ALT, ALKPHOS, BILITOT, PROT, ALBUMIN in the last 168 hours. No results for input(s): LIPASE, AMYLASE in the last 168 hours. No results for input(s): AMMONIA in the last 168 hours.  CBC: Recent Labs  Lab 08/12/18 0621  WBC 9.6  HGB 8.7*  HCT 25.9*  MCV 100.8*  PLT 180     Cardiac Enzymes: No results for input(s): CKTOTAL, CKMB, CKMBINDEX, TROPONINI in the last 168 hours.  BNP (last 3 results) No results for input(s): BNP in the last 8760 hours.  ProBNP (last 3 results) No results for input(s): PROBNP in the last 8760 hours.  Radiological Exams: Ir Gastrostomy Tube Mod Sed  Result Date: 08/15/2018 CLINICAL DATA:  Protein calorie malnutrition, needs enteral feeding support EXAM: PERC PLACEMENT GASTROSTOMY FLUOROSCOPY TIME:  11.3 minutes; 124 uGym2 DAP TECHNIQUE: The procedure, risks, benefits, and alternatives were explained to the patient's mother. Questions regarding the procedure were encouraged and answered. The mother understands and consents to the procedure. As antibiotic prophylaxis, cefazolin 2 g was ordered pre-procedure and administered intravenously within one hour of incision. A safe percutaneous approach was confirmed on recent CT. A 5 French angiographic catheter was placed as orogastric tube. The upper abdomen was prepped with Betadine, draped in usual sterile fashion, and infiltrated locally with 1% lidocaine. Intravenous Fentanyl and Versed 1mg  were administered as conscious sedation during continuous monitoring of the patient's  level of consciousness and physiological / cardiorespiratory status by the radiology RN, with a total moderate sedation time of 21 minutes. 0.5 mg glucagon given IV. Stomach was insufflated using air through the orogastric tube. An 3918 French sheath needle was advanced percutaneously into the gastric lumen under fluoroscopy. Gas could be aspirated and a small contrast injection confirmed intraluminal spread. The sheath was exchanged over a guidewire for a 9 [place]French vascular sheath, through which the snare device was advanced and used to snare a guidewire passed through the orogastric tube. This was withdrawn, and the snare attached to the 20 French pull-through gastrostomy tube, which was advanced antegrade, positioned  with the internal bumper securing the anterior gastric wall to the anterior abdominal wall. Small contrast injection confirms appropriate positioning. The external bumper was applied and the catheter was flushed. COMPLICATIONS: COMPLICATIONS none IMPRESSION: 1. Technically successful 20 French pull-through gastrostomy placement under fluoroscopy. Electronically Signed   By: Corlis Leak  Hassell M.D.   On: 08/15/2018 12:55    Assessment/Plan Active Problems:   Acute on chronic respiratory failure with hypoxia (HCC)   Bilateral pneumonia   Non-traumatic rhabdomyolysis   Polysubstance abuse (HCC)   Acute tubular necrosis (HCC)   Severe sepsis (HCC)   Aspiration pneumonia (HCC)   1. Acute on chronic respiratory failure with hypoxia continue oxygen therapy as well as supportive measures. 2. Bilateral pneumonia treated clinically. 3. Nontraumatic paralysis improved 4. Substance abuse at baseline 5. ATN resolved continue to monitor 6. Severe sepsis resolved Aspiration pneumonia treated continue to monitor  I have personally seen and evaluated the patient, evaluated laboratory and imaging results, formulated the assessment and plan and placed orders. The Patient requires high complexity decision making for assessment and support.  Case was discussed on Rounds with the Respiratory Therapy Staff  Yevonne PaxSaadat A Samiha Denapoli, MD [facility] Pulmonary Critical Care Medicine Sleep Medicine

## 2018-08-17 DIAGNOSIS — E876 Hypokalemia: Secondary | ICD-10-CM | POA: Diagnosis not present

## 2018-08-17 DIAGNOSIS — J9600 Acute respiratory failure, unspecified whether with hypoxia or hypercapnia: Secondary | ICD-10-CM | POA: Diagnosis not present

## 2018-08-17 DIAGNOSIS — J690 Pneumonitis due to inhalation of food and vomit: Secondary | ICD-10-CM | POA: Diagnosis not present

## 2018-08-17 DIAGNOSIS — F1910 Other psychoactive substance abuse, uncomplicated: Secondary | ICD-10-CM | POA: Diagnosis not present

## 2018-08-17 DIAGNOSIS — J9621 Acute and chronic respiratory failure with hypoxia: Secondary | ICD-10-CM | POA: Diagnosis not present

## 2018-08-17 DIAGNOSIS — R1310 Dysphagia, unspecified: Secondary | ICD-10-CM | POA: Diagnosis not present

## 2018-08-17 DIAGNOSIS — N170 Acute kidney failure with tubular necrosis: Secondary | ICD-10-CM | POA: Diagnosis not present

## 2018-08-17 LAB — BASIC METABOLIC PANEL
Anion gap: 10 (ref 5–15)
BUN: 13 mg/dL (ref 6–20)
CO2: 25 mmol/L (ref 22–32)
Calcium: 8.8 mg/dL — ABNORMAL LOW (ref 8.9–10.3)
Chloride: 98 mmol/L (ref 98–111)
Creatinine, Ser: 0.58 mg/dL — ABNORMAL LOW (ref 0.61–1.24)
GFR calc Af Amer: >60 mL/min (ref >60)
GFR calc non Af Amer: >60 mL/min (ref >60)
Glucose, Bld: 155 mg/dL — ABNORMAL HIGH (ref 70–99)
Potassium: 4.1 mmol/L (ref 3.5–5.1)
Sodium: 133 mmol/L — ABNORMAL LOW (ref 135–145)

## 2018-08-17 LAB — TRIGLYCERIDES: Triglycerides: 89 mg/dL (ref <150)

## 2018-08-17 LAB — PHOSPHORUS: Phosphorus: 2.6 mg/dL (ref 2.5–4.6)

## 2018-08-17 LAB — MAGNESIUM: Magnesium: 1.7 mg/dL (ref 1.7–2.4)

## 2018-08-17 NOTE — Progress Notes (Addendum)
Pulmonary Critical Care [facility] [place]   PULMONARY CRITICAL CARE SERVICE  PROGRESS NOTE  Date of Service: 08/17/2018  Mark Patterson  ZOX:[id number]  DOB: January 08, 1966   DOA: 07/16/2018  Referring Physician: Carron CurieAli Hijazi, MD  HPI: Mark Patterson is a 53 y.o. male seen for follow up of Acute on Chronic Respiratory Failure.  Patient continues on 2 L of oxygen via nasal cannula.  No distress noted at the time.  Medications: Reviewed on Rounds  Physical Exam:  Vitals: Pulse 85 respirations 22 BP 100 9195 O2 sat 94% temp 97.9  Ventilator Settings not currently on ventilator  . General: Comfortable at this time . Eyes: Grossly normal lids, irises & conjunctiva . ENT: grossly tongue is normal . Neck: no obvious mass . Cardiovascular: S1 S2 normal no gallop . Respiratory: No rales or rhonchi noted . Abdomen: soft . Skin: no rash seen on limited exam . Musculoskeletal: not rigid . Psychiatric:unable to assess . Neurologic: no seizure no involuntary movements         Lab Data:   Basic Metabolic Panel: Recent Labs  Lab 08/11/18 0731  08/12/18 0621 08/13/18 0557 08/14/18 0614 08/16/18 0538 08/16/18 2039 08/17/18 0352  NA  --   --  136 138  --  136  --  133*  K  --    < > 2.3* 3.0* 3.1* 2.6* 4.3 4.1  CL  --   --  99 102  --  98  --  98  CO2  --   --  27 25  --  28  --  25  GLUCOSE  --   --  112* 112*  --  131*  --  155*  BUN  --   --  5* <5*  --  <5*  --  13  CREATININE  --   --  0.47* 0.49*  --  0.54*  --  0.58*  CALCIUM  --   --  8.6* 8.9  --  8.7*  --  8.8*  MG 1.6*  --   --  1.9 1.7 1.6*  --  1.7  PHOS 4.0  --   --   --  4.3  --   --  2.6   < > = values in this interval not displayed.    ABG: No results for input(s): PHART, PCO2ART, PO2ART, HCO3, O2SAT in the last 168 hours.  Liver Function Tests: No results for input(s): AST, ALT, ALKPHOS, BILITOT, PROT, ALBUMIN in the last 168 hours. No results for input(s): LIPASE, AMYLASE in the last 168  hours. No results for input(s): AMMONIA in the last 168 hours.  CBC: Recent Labs  Lab 08/12/18 0621  WBC 9.6  HGB 8.7*  HCT 25.9*  MCV 100.8*  PLT 180    Cardiac Enzymes: No results for input(s): CKTOTAL, CKMB, CKMBINDEX, TROPONINI in the last 168 hours.  BNP (last 3 results) No results for input(s): BNP in the last 8760 hours.  ProBNP (last 3 results) No results for input(s): PROBNP in the last 8760 hours.  Radiological Exams: No results found.  Assessment/Plan Active Problems:   Acute on chronic respiratory failure with hypoxia (HCC)   Bilateral pneumonia   Non-traumatic rhabdomyolysis   Polysubstance abuse (HCC)   Acute tubular necrosis (HCC)   Severe sepsis (HCC)   Aspiration pneumonia (HCC)   1. Acute on chronic respiratory failure with hypoxia continue oxygen therapy and supportive measures. 2. Bilateral pneumonia treated clinically 3. Nontraumatic rhabdomyolysis improving 4. Substance  abuse at baseline 5. ATN resolved continue monitor 6. Severe sepsis resolved 7. Aspiration pneumonia treated continue to monitor   I have personally seen and evaluated the patient, evaluated laboratory and imaging results, formulated the assessment and plan and placed orders. The Patient requires high complexity decision making for assessment and support.  Case was discussed on Rounds with the Respiratory Therapy Staff  Yevonne Pax, MD [facility] Pulmonary Critical Care Medicine Sleep Medicine

## 2018-08-18 DIAGNOSIS — F1910 Other psychoactive substance abuse, uncomplicated: Secondary | ICD-10-CM | POA: Diagnosis not present

## 2018-08-18 DIAGNOSIS — J9600 Acute respiratory failure, unspecified whether with hypoxia or hypercapnia: Secondary | ICD-10-CM | POA: Diagnosis not present

## 2018-08-18 DIAGNOSIS — E871 Hypo-osmolality and hyponatremia: Secondary | ICD-10-CM | POA: Diagnosis not present

## 2018-08-18 DIAGNOSIS — E876 Hypokalemia: Secondary | ICD-10-CM | POA: Diagnosis not present

## 2018-08-18 DIAGNOSIS — J690 Pneumonitis due to inhalation of food and vomit: Secondary | ICD-10-CM | POA: Diagnosis not present

## 2018-08-18 DIAGNOSIS — R1310 Dysphagia, unspecified: Secondary | ICD-10-CM | POA: Diagnosis not present

## 2018-08-18 DIAGNOSIS — N170 Acute kidney failure with tubular necrosis: Secondary | ICD-10-CM | POA: Diagnosis not present

## 2018-08-18 DIAGNOSIS — J9621 Acute and chronic respiratory failure with hypoxia: Secondary | ICD-10-CM | POA: Diagnosis not present

## 2018-08-18 LAB — MAGNESIUM: Magnesium: 1.9 mg/dL (ref 1.7–2.4)

## 2018-08-18 NOTE — Progress Notes (Addendum)
Pulmonary Critical Care Medicine [facility] [place]   PULMONARY CRITICAL CARE SERVICE  PROGRESS NOTE  Date of Service: 08/18/2018  Mark Patterson  EYE:[id number]  DOB: [birth year]   DOA: 07/16/2018  Referring Physician: Carron Curie, MD  HPI: Mark Patterson is a 53 y.o. male seen for follow up of Acute on Chronic Respiratory Failure.  Patient remains on 2 L of oxygen via nasal cannula.  He is afebrile with no distress at this time.  Medications: Reviewed on Rounds  Physical Exam:  Vitals: Pulse 80 respirations 22 BP 134/56 O2 sat 90 four-point temp 97.5  Ventilator Settings not currently on ventilator  . General: Comfortable at this time . Eyes: Grossly normal lids, irises & conjunctiva . ENT: grossly tongue is normal . Neck: no obvious mass . Cardiovascular: S1 S2 normal no gallop . Respiratory: No rales rhonchi noted . Abdomen: soft . Skin: no rash seen on limited exam . Musculoskeletal: not rigid . Psychiatric:unable to assess . Neurologic: no seizure no involuntary movements         Lab Data:   Basic Metabolic Panel: Recent Labs  Lab 08/12/18 0621 08/13/18 0557 08/14/18 0614 08/16/18 0538 08/16/18 2039 08/17/18 0352 08/18/18 0714  NA 136 138  --  136  --  133*  --   K 2.3* 3.0* 3.1* 2.6* 4.3 4.1  --   CL 99 102  --  98  --  98  --   CO2 27 25  --  28  --  25  --   GLUCOSE 112* 112*  --  131*  --  155*  --   BUN 5* <5*  --  <5*  --  13  --   CREATININE 0.47* 0.49*  --  0.54*  --  0.58*  --   CALCIUM 8.6* 8.9  --  8.7*  --  8.8*  --   MG  --  1.9 1.7 1.6*  --  1.7 1.9  PHOS  --   --  4.3  --   --  2.6  --     ABG: No results for input(s): PHART, PCO2ART, PO2ART, HCO3, O2SAT in the last 168 hours.  Liver Function Tests: No results for input(s): AST, ALT, ALKPHOS, BILITOT, PROT, ALBUMIN in the last 168 hours. No results for input(s): LIPASE, AMYLASE in the last 168 hours. No results for input(s): AMMONIA in the last 168 hours.  CBC: Recent  Labs  Lab 08/12/18 0621  WBC 9.6  HGB 8.7*  HCT 25.9*  MCV 100.8*  PLT 180    Cardiac Enzymes: No results for input(s): CKTOTAL, CKMB, CKMBINDEX, TROPONINI in the last 168 hours.  BNP (last 3 results) No results for input(s): BNP in the last 8760 hours.  ProBNP (last 3 results) No results for input(s): PROBNP in the last 8760 hours.  Radiological Exams: No results found.  Assessment/Plan Active Problems:   Acute on chronic respiratory failure with hypoxia (HCC)   Bilateral pneumonia   Non-traumatic rhabdomyolysis   Polysubstance abuse (HCC)   Acute tubular necrosis (HCC)   Severe sepsis (HCC)   Aspiration pneumonia (HCC)   1. Acute on chronic respiratory failure with hypoxia continue oxygen therapy and supportive measures at this time 2. Bilateral pneumonia treated 3. Nontraumatic myolysis improving 4. Substance abuse at baseline 5. ATN resolved continue to monitor 6. Severe sepsis resolved 7. Aspiration pneumonia continue to monitor   I have personally seen and evaluated the patient, evaluated laboratory and imaging results, formulated the assessment  and plan and placed orders. The Patient requires high complexity decision making for assessment and support.  Case was discussed on Rounds with the Respiratory Therapy Staff  Allyne Gee, MD [facility] Pulmonary Critical Care Medicine Sleep Medicine

## 2018-08-19 DIAGNOSIS — J9600 Acute respiratory failure, unspecified whether with hypoxia or hypercapnia: Secondary | ICD-10-CM | POA: Diagnosis not present

## 2018-08-19 DIAGNOSIS — F1910 Other psychoactive substance abuse, uncomplicated: Secondary | ICD-10-CM | POA: Diagnosis not present

## 2018-08-19 DIAGNOSIS — E876 Hypokalemia: Secondary | ICD-10-CM | POA: Diagnosis not present

## 2018-08-19 DIAGNOSIS — J690 Pneumonitis due to inhalation of food and vomit: Secondary | ICD-10-CM | POA: Diagnosis not present

## 2018-08-19 DIAGNOSIS — N170 Acute kidney failure with tubular necrosis: Secondary | ICD-10-CM | POA: Diagnosis not present

## 2018-08-19 DIAGNOSIS — R1310 Dysphagia, unspecified: Secondary | ICD-10-CM | POA: Diagnosis not present

## 2018-08-19 DIAGNOSIS — E871 Hypo-osmolality and hyponatremia: Secondary | ICD-10-CM | POA: Diagnosis not present

## 2018-08-19 DIAGNOSIS — J9621 Acute and chronic respiratory failure with hypoxia: Secondary | ICD-10-CM | POA: Diagnosis not present

## 2018-08-19 NOTE — Progress Notes (Addendum)
Pulmonary Critical Care Medicine [facility] [place]   PULMONARY CRITICAL CARE SERVICE  PROGRESS NOTE  Date of Service: 08/19/2018  Mark Patterson  UKR:[id number]  DOB: [birth year]   DOA: 07/16/2018  Referring Physician: Carron Curie, MD  HPI: Mark Patterson is a 53 y.o. male seen for follow up of Acute on Chronic Respiratory Failure.  Patient continues on 2 L of oxygen via nasal cannula.  Patient is afebrile with no acute distress noted at this time.  Medications: Reviewed on Rounds  Physical Exam:  Vitals: Pulse 79 respiration 17 BP 168/95 O2 sat 90% temp 96.0  Ventilator Settings 2 L nasal cannula  . General: Comfortable at this time . Eyes: Grossly normal lids, irises & conjunctiva . ENT: grossly tongue is normal . Neck: no obvious mass . Cardiovascular: S1 S2 normal no gallop . Respiratory: No rales or rhonchi noted . Abdomen: soft . Skin: no rash seen on limited exam . Musculoskeletal: not rigid . Psychiatric:unable to assess . Neurologic: no seizure no involuntary movements         Lab Data:   Basic Metabolic Panel: Recent Labs  Lab 08/13/18 0557 08/14/18 0614 08/16/18 0538 08/16/18 2039 08/17/18 0352 08/18/18 0714  NA 138  --  136  --  133*  --   K 3.0* 3.1* 2.6* 4.3 4.1  --   CL 102  --  98  --  98  --   CO2 25  --  28  --  25  --   GLUCOSE 112*  --  131*  --  155*  --   BUN <5*  --  <5*  --  13  --   CREATININE 0.49*  --  0.54*  --  0.58*  --   CALCIUM 8.9  --  8.7*  --  8.8*  --   MG 1.9 1.7 1.6*  --  1.7 1.9  PHOS  --  4.3  --   --  2.6  --     ABG: No results for input(s): PHART, PCO2ART, PO2ART, HCO3, O2SAT in the last 168 hours.  Liver Function Tests: No results for input(s): AST, ALT, ALKPHOS, BILITOT, PROT, ALBUMIN in the last 168 hours. No results for input(s): LIPASE, AMYLASE in the last 168 hours. No results for input(s): AMMONIA in the last 168 hours.  CBC: No results for input(s): WBC, NEUTROABS, HGB, HCT, MCV, PLT in the  last 168 hours.  Cardiac Enzymes: No results for input(s): CKTOTAL, CKMB, CKMBINDEX, TROPONINI in the last 168 hours.  BNP (last 3 results) No results for input(s): BNP in the last 8760 hours.  ProBNP (last 3 results) No results for input(s): PROBNP in the last 8760 hours.  Radiological Exams: No results found.  Assessment/Plan Active Problems:   Acute on chronic respiratory failure with hypoxia (HCC)   Bilateral pneumonia   Non-traumatic rhabdomyolysis   Polysubstance abuse (HCC)   Acute tubular necrosis (HCC)   Severe sepsis (HCC)   Aspiration pneumonia (HCC)   1. Acute on chronic respiratory failure with hypoxia continue oxygen therapy and wean as tolerated.  Continue supportive measures and pulmonary toilet. 2. Bilateral pneumonia treated 3. Nontraumatic myelolysis improving 4. Substance abuse at baseline 5. ATN resolved continue to monitor 6. Severe sepsis resolved 7. Aspiration pneumonia continue to monitor   I have personally seen and evaluated the patient, evaluated laboratory and imaging results, formulated the assessment and plan and placed orders. The Patient requires high complexity decision making for assessment and support.  Case was discussed on Rounds with the Respiratory Therapy Staff  Allyne Gee, MD [facility] Pulmonary Critical Care Medicine Sleep Medicine

## 2018-08-20 DIAGNOSIS — J690 Pneumonitis due to inhalation of food and vomit: Secondary | ICD-10-CM | POA: Diagnosis not present

## 2018-08-20 DIAGNOSIS — Z743 Need for continuous supervision: Secondary | ICD-10-CM | POA: Diagnosis not present

## 2018-08-20 DIAGNOSIS — J9600 Acute respiratory failure, unspecified whether with hypoxia or hypercapnia: Secondary | ICD-10-CM | POA: Diagnosis not present

## 2018-08-20 DIAGNOSIS — E876 Hypokalemia: Secondary | ICD-10-CM | POA: Diagnosis not present

## 2018-08-20 DIAGNOSIS — R4182 Altered mental status, unspecified: Secondary | ICD-10-CM | POA: Diagnosis not present

## 2018-08-20 DIAGNOSIS — E871 Hypo-osmolality and hyponatremia: Secondary | ICD-10-CM | POA: Diagnosis not present

## 2018-08-20 DIAGNOSIS — R279 Unspecified lack of coordination: Secondary | ICD-10-CM | POA: Diagnosis not present

## 2018-08-20 DIAGNOSIS — J189 Pneumonia, unspecified organism: Secondary | ICD-10-CM | POA: Diagnosis not present

## 2018-08-20 LAB — MAGNESIUM: Magnesium: 1.7 mg/dL (ref 1.7–2.4)

## 2018-08-20 LAB — TRIGLYCERIDES: Triglycerides: 111 mg/dL (ref <150)

## 2018-08-20 LAB — PHOSPHORUS: Phosphorus: 3 mg/dL (ref 2.5–4.6)

## 2018-08-20 NOTE — Progress Notes (Signed)
Pulmonary Critical Care Medicine [facility] [place]   PULMONARY CRITICAL CARE SERVICE  PROGRESS NOTE  Date of Service: 08/20/2018  Mark Patterson  KGU:[id number]  DOB: 07/26/1965   DOA: 07/16/2018  Referring Physician: Carron Curie, MD  HPI: Mark Patterson is a 53 y.o. male seen for follow up of Acute on Chronic Respiratory Failure.  Patient remains on 2 L of oxygen via nasal cannula.  Remains afebrile with no distress at this time.  Medications: Reviewed on Rounds  Physical Exam:  Vitals: Pulse 93 respiration 20 BP 155/93 O2 sat 98% temp 98.5  Ventilator Settings 2 L nasal cannula  . General: Comfortable at this time . Eyes: Grossly normal lids, irises & conjunctiva . ENT: grossly tongue is normal . Neck: no obvious mass . Cardiovascular: S1 S2 normal no gallop . Respiratory: No rales or rhonchi noted . Abdomen: soft . Skin: no rash seen on limited exam . Musculoskeletal: not rigid . Psychiatric:unable to assess . Neurologic: no seizure no involuntary movements         Lab Data:   Basic Metabolic Panel: Recent Labs  Lab 08/14/18 0614 08/16/18 0538 08/16/18 2039 08/17/18 0352 08/18/18 0714 08/20/18 0713  NA  --  136  --  133*  --   --   K 3.1* 2.6* 4.3 4.1  --   --   CL  --  98  --  98  --   --   CO2  --  28  --  25  --   --   GLUCOSE  --  131*  --  155*  --   --   BUN  --  <5*  --  13  --   --   CREATININE  --  0.54*  --  0.58*  --   --   CALCIUM  --  8.7*  --  8.8*  --   --   MG 1.7 1.6*  --  1.7 1.9 1.7  PHOS 4.3  --   --  2.6  --  3.0    ABG: No results for input(s): PHART, PCO2ART, PO2ART, HCO3, O2SAT in the last 168 hours.  Liver Function Tests: No results for input(s): AST, ALT, ALKPHOS, BILITOT, PROT, ALBUMIN in the last 168 hours. No results for input(s): LIPASE, AMYLASE in the last 168 hours. No results for input(s): AMMONIA in the last 168 hours.  CBC: No results for input(s): WBC, NEUTROABS, HGB, HCT, MCV, PLT in the last 168  hours.  Cardiac Enzymes: No results for input(s): CKTOTAL, CKMB, CKMBINDEX, TROPONINI in the last 168 hours.  BNP (last 3 results) No results for input(s): BNP in the last 8760 hours.  ProBNP (last 3 results) No results for input(s): PROBNP in the last 8760 hours.  Radiological Exams: No results found.  Assessment/Plan Active Problems:   Acute on chronic respiratory failure with hypoxia (HCC)   Bilateral pneumonia   Non-traumatic rhabdomyolysis   Polysubstance abuse (HCC)   Acute tubular necrosis (HCC)   Severe sepsis (HCC)   Aspiration pneumonia (HCC)   1. Acute on chronic respiratory failure with hypoxia continue oxygen therapy as tolerated continue supportive measures and pulmonary toilet. 2. Bilateral pneumonia treated 3. Nontraumatic myelolysis improving 4. Substance abuse at baseline 5. ATN resolved continue to monitor 6. Severe sepsis resolved 7. Aspiration ammonia continue to monitor   I have personally seen and evaluated the patient, evaluated laboratory and imaging results, formulated the assessment and plan and placed orders. The Patient requires high  complexity decision making for assessment and support.  Case was discussed on Rounds with the Respiratory Therapy Staff  Allyne Gee, MD [facility] Pulmonary Critical Care Medicine Sleep Medicine

## 2018-09-16 LAB — BLOOD GAS, ARTERIAL
Acid-Base Excess: 3.8 mmol/L — ABNORMAL HIGH (ref 0.0–2.0)
Bicarbonate: 28.4 mmol/L — ABNORMAL HIGH (ref 20.0–28.0)
O2 Saturation: 96 %
Patient temperature: 98.6
pCO2 arterial: 47.8 mmHg (ref 32.0–48.0)
pH, Arterial: 7.392 (ref 7.350–7.450)
pO2, Arterial: 85.4 mmHg (ref 83.0–108.0)

## 2018-09-24 ENCOUNTER — Other Ambulatory Visit: Payer: Self-pay | Admitting: *Deleted

## 2018-09-24 NOTE — Patient Outreach (Signed)
[facility]) Care Management  09/24/2018  Donie Estis June 02, 1965 [id number]    Per PatientPing member is currently at [facility]. Member screened for potential [facility] Care Management services as a benefit of his [employer].  Attempting to outreach to find out who [facility] Primary Care Provider is. Mr. Umberger is on the All Payers List for Medicare as being eligible for [facility]. However, he is listed unattributed to a [facility] Provider. This could indicate member is not necessarily eligible for [facility] Care Management services. Therefore, Clinical research associate needs to confirm whether member has a [facility] Provider or not in order to be eligible for [facility] Care Management services.  Discussed above with [facility] RN. Writer advised to contact discharge planner at facility to obtain appropriate contact information for member and etc. Discharged planner information received.   Left voicemail message for discharge planner at [facility] to request call back.  Will continue to follow and await to hear back for from facility dc planner.     Raiford Noble, MSN-Ed, RN,BSN [facility] Post Acute Care Coordinator [phone]

## 2018-09-24 NOTE — Patient Outreach (Signed)
[facility]) Care Management  09/24/2018  Mark Patterson 25-Jan-1966 [id number]  Telephone call received from [facility], discharge planner at [facility]. Mark Patterson reports that member's mother is Mark Patterson and that she is the appropriate contact person. States she has been communicating with member's mother Mark Patterson about discharge planning. Confirmed that the telephone numbers in the chart are the correct numbers.  Made Mark Patterson aware that writer is making attempts to find out whether member is connected with at [facility] Provider.   Mark Patterson, shortly after conversation, sent notification to writer to make aware that she spoke with mom who indicates Mr. Mark Patterson does not have a PCP.   Writer attempted to call on both telephone numbers listed for Mark Patterson (mom) [phone] and [phone]. There was no answer and neither lines had voicemail set up to leave a voicemail message. Attempting to reach Ms. Mark Patterson to see if she needs assistance in connecting member with a PCP.   Notification sent to both discharge planner and [facility] RN to make aware of writer's attempted outreach calls to [facility] [place] mother.   Mark Noble, MSN-Ed, RN,BSN [facility] Post Acute Care Coordinator [phone]

## 2018-09-25 ENCOUNTER — Other Ambulatory Visit: Payer: Self-pay | Admitting: *Deleted

## 2018-09-25 NOTE — Patient Outreach (Addendum)
[facility]) Care Management  09/25/2018  Neko Komisar 1966/01/12 [id number]   Attempt made to reach member's mother to discuss the importance of connecting Mr. Byrdsong with a  Primary Care Provider. Mr. Dougall is currently at [facility] receiving rehab therapy.   Called both numbers listed in system without success. Unable to leave voicemail message on either number.  Member has been assessed for potential [facility] Care Management needs as a benefit of his [employer]. However, he does not have a confirmed PCP per dc planner. Writer has been unable to reach member's mother to discuss further.   Raiford Noble, MSN-Ed, RN,BSN [facility] Post Acute Care Coordinator [phone]

## 2018-10-11 ENCOUNTER — Other Ambulatory Visit (HOSPITAL_COMMUNITY): Payer: Self-pay | Admitting: Family Medicine

## 2018-10-11 DIAGNOSIS — E43 Unspecified severe protein-calorie malnutrition: Secondary | ICD-10-CM

## 2018-10-16 ENCOUNTER — Ambulatory Visit (HOSPITAL_COMMUNITY): Admission: RE | Admit: 2018-10-16 | Payer: Medicaid Other | Source: Ambulatory Visit

## 2018-10-22 ENCOUNTER — Inpatient Hospital Stay (HOSPITAL_COMMUNITY): Admission: RE | Admit: 2018-10-22 | Payer: Medicaid Other | Source: Ambulatory Visit

## 2018-10-28 ENCOUNTER — Encounter (HOSPITAL_COMMUNITY): Payer: Self-pay | Admitting: Physician Assistant

## 2018-10-28 ENCOUNTER — Ambulatory Visit (HOSPITAL_COMMUNITY)
Admission: RE | Admit: 2018-10-28 | Discharge: 2018-10-28 | Disposition: A | Discharge status: Home / Self Care | Payer: Medicaid Other | Source: Ambulatory Visit | Attending: Family Medicine | Admitting: Family Medicine

## 2018-10-28 ENCOUNTER — Other Ambulatory Visit: Payer: Self-pay

## 2018-10-28 DIAGNOSIS — Z431 Encounter for attention to gastrostomy: Secondary | ICD-10-CM | POA: Insufficient documentation

## 2018-10-28 DIAGNOSIS — E43 Unspecified severe protein-calorie malnutrition: Secondary | ICD-10-CM | POA: Diagnosis not present

## 2018-10-28 HISTORY — PX: IR GASTROSTOMY TUBE REMOVAL: IMG5492

## 2018-10-28 MED ORDER — LIDOCAINE VISCOUS HCL 2 % MT SOLN
OROMUCOSAL | Status: AC
  Filled 2018-10-28: qty 15

## 2018-10-28 NOTE — Progress Notes (Signed)
Pre-procedure diagnosis: Dysphagia Post-procedure diagnosis: Same  Successful bedside removal of intact pull through G-tube. No immediate post procedural complications. EBL: zero  Please see imaging section of Epic for full dictation.  Joaquim Nam 10/28/2018 12:09 PM

## 2019-01-21 DIAGNOSIS — G8929 Other chronic pain: Secondary | ICD-10-CM | POA: Insufficient documentation

## 2019-02-13 DIAGNOSIS — F1910 Other psychoactive substance abuse, uncomplicated: Secondary | ICD-10-CM | POA: Insufficient documentation

## 2019-02-25 ENCOUNTER — Other Ambulatory Visit: Payer: Self-pay | Admitting: *Deleted

## 2019-02-25 ENCOUNTER — Encounter: Payer: Self-pay | Admitting: *Deleted

## 2019-02-26 ENCOUNTER — Ambulatory Visit: Payer: Medicaid Other | Admitting: Diagnostic Neuroimaging

## 2019-02-26 ENCOUNTER — Encounter: Payer: Self-pay | Admitting: Diagnostic Neuroimaging

## 2019-02-26 ENCOUNTER — Other Ambulatory Visit: Payer: Self-pay

## 2019-02-26 VITALS — BP 107/77 | HR 107 | Temp 98.0°F | Ht 69.0 in | Wt 130.2 lb

## 2019-02-26 DIAGNOSIS — G621 Alcoholic polyneuropathy: Secondary | ICD-10-CM

## 2019-02-26 DIAGNOSIS — G372 Central pontine myelinolysis: Secondary | ICD-10-CM

## 2019-02-26 DIAGNOSIS — G894 Chronic pain syndrome: Secondary | ICD-10-CM | POA: Diagnosis not present

## 2019-02-26 NOTE — Patient Instructions (Signed)
-   try to improve nutrition (plants and vegetable and protein)  - start daily multi-vitamin

## 2019-02-26 NOTE — Progress Notes (Addendum)
Per Dr Leta Baptist, called Mark Patterson, PCP office and spoke with Tammy in medical records. Requested a copy of [facility] hospital discharge summary and neurologist's notes. Tammy stated she will fax. 03/04/19 received notes from [facility]; placed on Dr [facility] desk for review.

## 2019-02-26 NOTE — Progress Notes (Signed)
[facility]  PATIENT: Mark Patterson DOB: Oct 22, 1965  REFERRING CLINICIAN: Nyra Capes HISTORY FROM: patient  REASON FOR VISIT: new consult    HISTORICAL  CHIEF COMPLAINT:  Chief Complaint  Patient presents with  . Aquired cerebral atrophy    rm 7 New Pt "In hospital in coma for 2 months"    HISTORY OF PRESENT ILLNESS:   53 year old male here for evaluation of hospital follow-up, pontine myelolysis, encephalopathy, memory loss, gait difficulty, chronic pain.  Patient has long history of alcohol abuse since age 38 years old.  He may drink 3-4 times per day, up to 15 beers per day.  Patient had been to rehabilitation when he was younger.  March 2020 patient was found unresponsive at home.  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  Patient was severely ill, in the intensive care unit for several weeks, and took several months to recover.  Initially he was using a wheelchair and walker.  Now patient able to use a cane.  Patient no longer drinking alcohol.  Now patient living back at home.  He has chronic pain in his neck, low back, numbness and pain in his feet.  History of cervical spine injury when he fell from a horse in 2015 status post surgery.  Also left heel fracture injury after a fall in 2018.    REVIEW OF SYSTEMS: Full 14 system review of systems performed and negative with exception of: As per HPI.  ALLERGIES: No Known Allergies  HOME MEDICATIONS: Outpatient Medications Prior to Visit  Medication Sig Dispense Refill  . DULoxetine (CYMBALTA) 30 MG capsule Take 30 mg by mouth 2 (two) times daily.    Marland Kitchen gabapentin (NEURONTIN) 100 MG capsule Unsure of dose    . naproxen (NAPROSYN) 500 MG tablet TAKE 1 TABLET BY MOUTH BID WITH MEALS     No facility-administered medications prior to visit.     PAST MEDICAL HISTORY: Past Medical History:  Diagnosis Date  .  Acute on chronic respiratory failure with hypoxia ([place])   . Acute tubular necrosis ([place])   . Aspiration pneumonia ([place])   . Bilateral pneumonia   . Cerebral atrophy ([place])   . Non-traumatic rhabdomyolysis   . Polysubstance abuse ([place])   . Severe sepsis ([place])   . 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 CERVICAL FUSION     "3 pins"  . IR GASTROSTOMY TUBE MOD SED  08/15/2018  . IR GASTROSTOMY TUBE REMOVAL  10/28/2018    FAMILY HISTORY: Family History  Problem Relation Age of Onset  . Fibromyalgia Mother   . Arthritis Mother   . Heart attack Sister     SOCIAL HISTORY: Social History   Socioeconomic History  . Marital status: Single    Spouse name: Not on file  . Number of children: 2  . Years of education: Not on file  . Highest education level: 11th grade  Occupational History    Comment: filed for disability  Social Needs  . Financial resource strain: Not on file  . Food insecurity    Worry: Not on file    Inability: Not on file  . Transportation needs    Medical: Not on file    Non-medical: Not on file  Tobacco Use  . Smoking status: Current Every Day Smoker    Packs/day: 0.50    Types:  Cigarettes  . Smokeless tobacco: Never Used  . Tobacco comment: < 1ppd  Substance and Sexual Activity  . Alcohol use: Not on file    Comment: 02/26/19 "none since MArch 2020", hx heavy ETOH per notes  . Drug use: Not Currently  . Sexual activity: Not on file  Lifestyle  . Physical activity    Days per week: Not on file    Minutes per session: Not on file  . Stress: Not on file  Relationships  . Social Musician on phone: Not on file    Gets together: Not on file    Attends religious service: Not on file    Active member of club or organization: Not on file    Attends meetings of clubs or organizations: Not on file    Relationship status: Not on file  . Intimate partner violence     Fear of current or ex partner: Not on file    Emotionally abused: Not on file    Physically abused: Not on file    Forced sexual activity: Not on file  Other Topics Concern  . Not on file  Social History Narrative   Lives parents, mom bed bound     PHYSICAL EXAM  GENERAL EXAM/CONSTITUTIONAL: Vitals:  Vitals:   02/26/19 1024  BP: 107/77  Pulse: (!) 107  Temp: 98 F (36.7 C)  Weight: 130 lb 3.2 oz (59.1 kg)  Height: 5\' 9"  (1.753 m)     Body mass index is 19.23 kg/m. Wt Readings from Last 3 Encounters:  02/26/19 130 lb 3.2 oz (59.1 kg)     Patient is in no distress; well developed, nourished and groomed  DECR RANGE OF MOTION IN NECK  CARDIOVASCULAR:  Examination of carotid arteries is normal; no carotid bruits  Regular rate and rhythm, no murmurs  Examination of peripheral vascular system by observation and palpation is normal  EYES:  Ophthalmoscopic exam of optic discs and posterior segments is normal; no papilledema or hemorrhages  No exam data present  MUSCULOSKELETAL:  Gait, strength, tone, movements noted in Neurologic exam below  NEUROLOGIC: MENTAL STATUS:  No flowsheet data found.  awake, alert, oriented to person, place and time  recent and remote memory intact  normal attention and concentration  language fluent, comprehension intact, naming intact  fund of knowledge appropriate  CRANIAL NERVE:   2nd - no papilledema on fundoscopic exam  2nd, 3rd, 4th, 6th - pupils equal and reactive to light, visual fields full to confrontation, extraocular muscles intact, no nystagmus  5th - facial sensation symmetric  7th - facial strength symmetric  8th - hearing intact  9th - palate elevates symmetrically, uvula midline  11th - shoulder shrug symmetric  12th - tongue protrusion midline  MOTOR:   normal bulk and tone  BUE PROX 4; DISTAL 3  BLE PROX 3; DISTAL 1-2  SENSORY:   normal and symmetric to light touch; DECR IN FEET   COORDINATION:   finger-nose-finger, fine finger movements SLOW; ATAXIA IN LUE  REFLEXES:   deep tendon reflexes TRACE and symmetric  GAIT/STATION:   UNSTEADY GAIT; BILATERAL FOOT DROP     DIAGNOSTIC DATA (LABS, IMAGING, TESTING) - I reviewed patient records, labs, notes, testing and imaging myself where available.  Lab Results  Component Value Date   WBC 9.6 08/12/2018   HGB 8.7 (L) 08/12/2018   HCT 25.9 (L) 08/12/2018   MCV 100.8 (H) 08/12/2018   PLT 180 08/12/2018  Component Value Date/Time   NA 133 (L) 08/17/2018 0352   K 4.1 08/17/2018 0352   CL 98 08/17/2018 0352   CO2 25 08/17/2018 0352   GLUCOSE 155 (H) 08/17/2018 0352   BUN 13 08/17/2018 0352   CREATININE 0.58 (L) 08/17/2018 0352   CALCIUM 8.8 (L) 08/17/2018 0352   PROT 6.0 (L) 07/27/2018 0034   ALBUMIN 2.4 (L) 07/27/2018 0034   AST 30 07/27/2018 0034   ALT 19 07/27/2018 0034   ALKPHOS 99 07/27/2018 0034   BILITOT 0.3 07/27/2018 0034   GFRNONAA >60 08/17/2018 0352   GFRAA >60 08/17/2018 0352   Lab Results  Component Value Date   TRIG 111 08/20/2018   No results found for: HGBA1C No results found for: VITAMINB12 Lab Results  Component Value Date   TSH 9.677 (H) 07/18/2018    07/26/16 xray cervical spine [I reviewed images myself and agree with interpretation. -VRP]  - prior C4-C6 fusion hardware  07/13/18 MRI brain [I reviewed images myself and agree with interpretation. -VRP]  1. Abnormal diffusion signal in the pons with shape primarily concerning for osmotic demyelination 2. Age advanced atrophy. 3. Small remote right parietal infarct.    ASSESSMENT AND PLAN  53 y.o. year old male here with:  Dx:  1. Central pontine myelinolysis (HCC)   2. Alcoholic peripheral neuropathy (HCC)   3. Chronic pain syndrome     PLAN:  ENCEPHALOPATHY  (central pontine myelinolysis, etoh encephalopathy / atrophy, hypoxic-ischemic encephalopathy) - continue alcohol cessation - nutrition / vitamin  support; add multi-vitamin daily - follow up with pain mgmt - supportive care per PCP  GAIT DIFFICULTY (cervical spine fracture status post surgery, alcoholic polyneuropathy, alcoholic cerebellar degeneration) - use cane; continue PT exercises; supportive care  Return for return to PCP, pending if symptoms worsen or fail to improve.    Suanne MarkerVIKRAM R. PENUMALLI, MD 02/26/2019, 11:05 AM Certified in Neurology, Neurophysiology and Neuroimaging  [facility] [address], [address] [place], [place] [address] [phone]

## 2019-03-13 DIAGNOSIS — M79671 Pain in right foot: Secondary | ICD-10-CM | POA: Insufficient documentation

## 2019-03-13 DIAGNOSIS — M51369 Other intervertebral disc degeneration, lumbar region without mention of lumbar back pain or lower extremity pain: Secondary | ICD-10-CM | POA: Insufficient documentation

## 2019-03-13 DIAGNOSIS — G629 Polyneuropathy, unspecified: Secondary | ICD-10-CM | POA: Insufficient documentation

## 2019-06-12 DIAGNOSIS — G8929 Other chronic pain: Secondary | ICD-10-CM | POA: Insufficient documentation

## 2019-07-10 ENCOUNTER — Telehealth: Payer: Self-pay | Admitting: Diagnostic Neuroimaging

## 2019-07-10 ENCOUNTER — Encounter: Payer: Self-pay | Admitting: Diagnostic Neuroimaging

## 2019-07-10 NOTE — Telephone Encounter (Signed)
Patient called stating he would like a telephone apt (doesn't know how to use a computer/mychart) and would like to touch base about his neuropathy states it hasnt changed much. States he can hardly walk and his egs hurt.

## 2019-07-10 NOTE — Telephone Encounter (Signed)
Called patient who stated he's not getting better. He lives in [place], doesn't drive, has some memory issues. His friend that helps him is now working and going to school. He has smart phone but doesn't think he can do video visit, stated he has problems using it. I informed him MD is out of office until next Tues. PCP is [facility] [place], Dr Para March. He requests a telephone FU with Dr Marjory Lies. I advised will discuss with MD next Tues and let hm know. Patient verbalized understanding, appreciation.

## 2019-07-15 NOTE — Telephone Encounter (Signed)
Called patient and set up tele visit for next Monday. He verbalized understanding, appreciation.

## 2019-07-15 NOTE — Telephone Encounter (Signed)
Ok for phone call televisit. -VRP

## 2019-07-21 ENCOUNTER — Ambulatory Visit: Payer: Self-pay | Admitting: Diagnostic Neuroimaging

## 2019-07-21 NOTE — Telephone Encounter (Addendum)
Called patient and advised Dr Marjory Lies was unable to reach him by phone at 1 pm for FU. He stated he hasn't been on phone except in oast 5 minutes. Per Dr Marjory Lies I advised that from a neurological standpoint he had nothing further to offer. I reviewed Dr Richrd Humbles plan, recommendations per last office note. He stated that his memory is his problem. I advised I'll mail him a copy of AVS; he agreed, verbalized understanding, appreciation. . I listed all Dr Richrd Humbles recommendations on AVS, placed in mail.

## 2019-07-21 NOTE — Telephone Encounter (Signed)
MD was unable to reach patient by phone today for follow up as patient requested.

## 2019-08-19 ENCOUNTER — Telehealth: Payer: Self-pay | Admitting: Diagnostic Neuroimaging

## 2019-08-19 NOTE — Telephone Encounter (Signed)
pt wants to discuss his diagnosis(he does not recall the name of it) please call.  Pt states something is going is going on in his and he believes the diagnosis begins with a S but he does not recall the name.

## 2019-08-20 NOTE — Telephone Encounter (Addendum)
Called patient who wanted to know his diagnosis; we discussed in detail Dr Richrd Humbles diagnosis : encephalopathy and his recommendations for patient to take care of himself. Patient verbalized understanding, appreciation.

## 2019-09-25 IMAGING — DX PORTABLE CHEST - 1 VIEW
1 series · 1 of 1 positions shown · non-contrast
Comparison: One-view chest x-ray 07/19/2018 and 07/17/2018.

CLINICAL DATA: Sudden onset of shortness of breath beginning this
afternoon.

EXAM:
PORTABLE CHEST 1 VIEW

[chest ap]
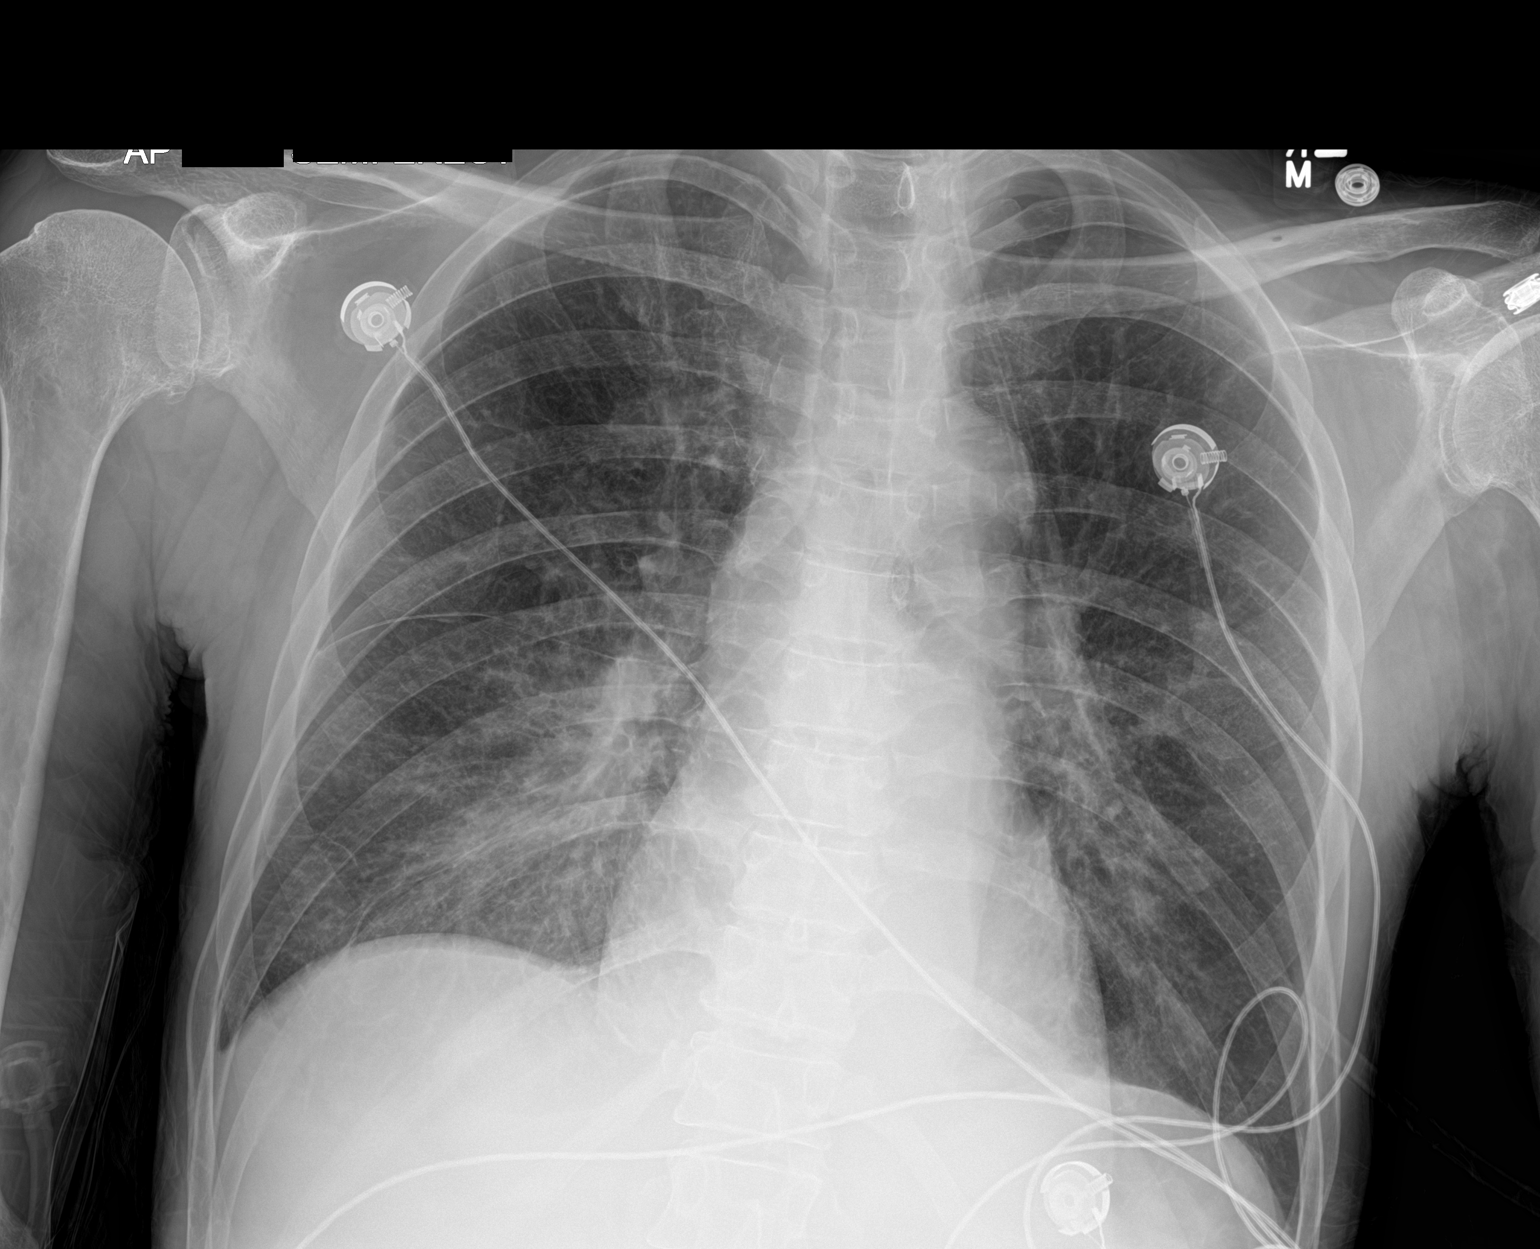

[1 of 1 positions shown; findings below may reference images not displayed]

FINDINGS: The heart size is normal. Previously noted left-sided airspace
disease is cleared. There is residual right lower lobe airspace
disease at has incompletely cleared. Moderate pulmonary vascular
congestion is present bilaterally. No other focal consolidation is
present. Effusions have improved bilaterally.
IMPRESSION: 1. Improving bilateral airspace disease with some residual right
lower lobe airspace disease.
2. No new airspace disease.

## 2019-09-26 IMAGING — DX PORTABLE CHEST - 1 VIEW
1 series · 2 of 2 positions shown · non-contrast
Comparison: 07/27/2018

CLINICAL DATA: PICC line placement

EXAM:
PORTABLE CHEST 1 VIEW

[Series 1: chest · 0.14mm/px · 2 of 2 slices shown]
[im 1/2]
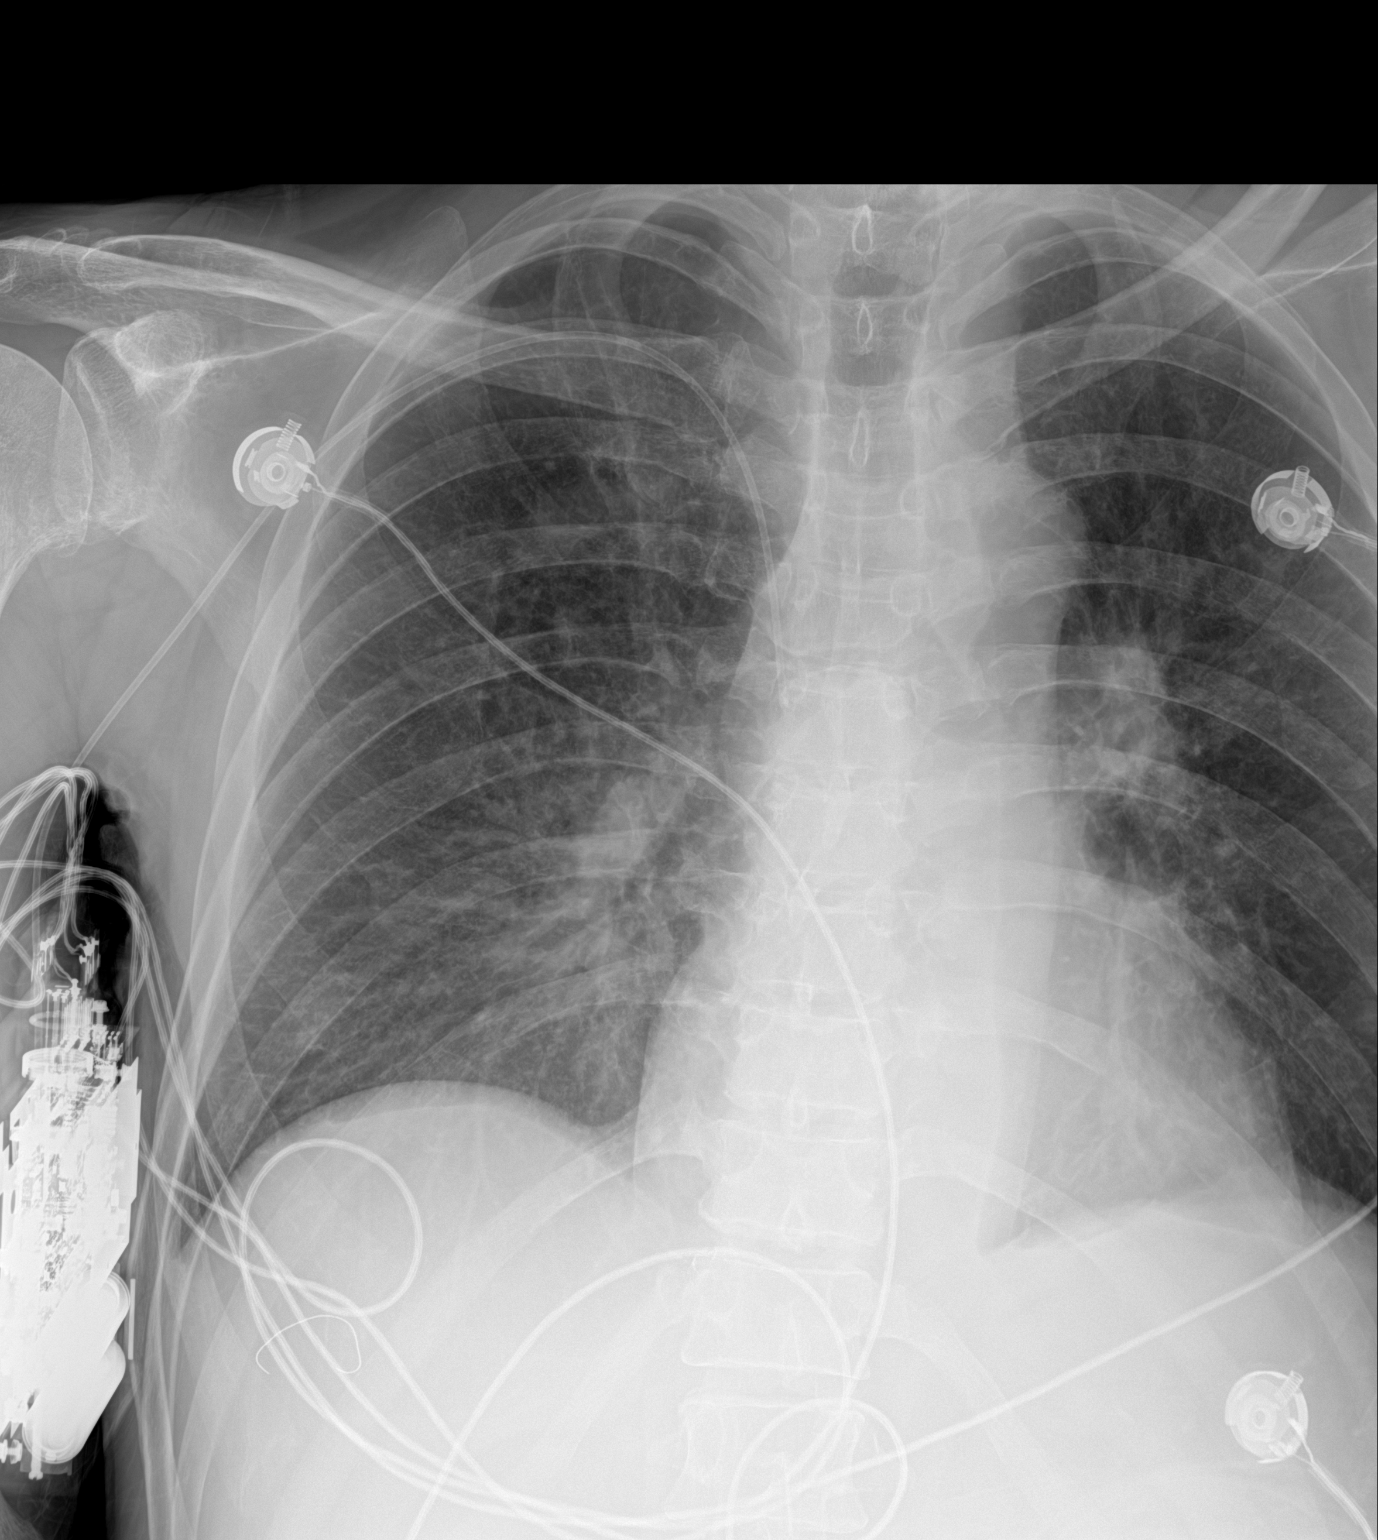
[im 2/2]
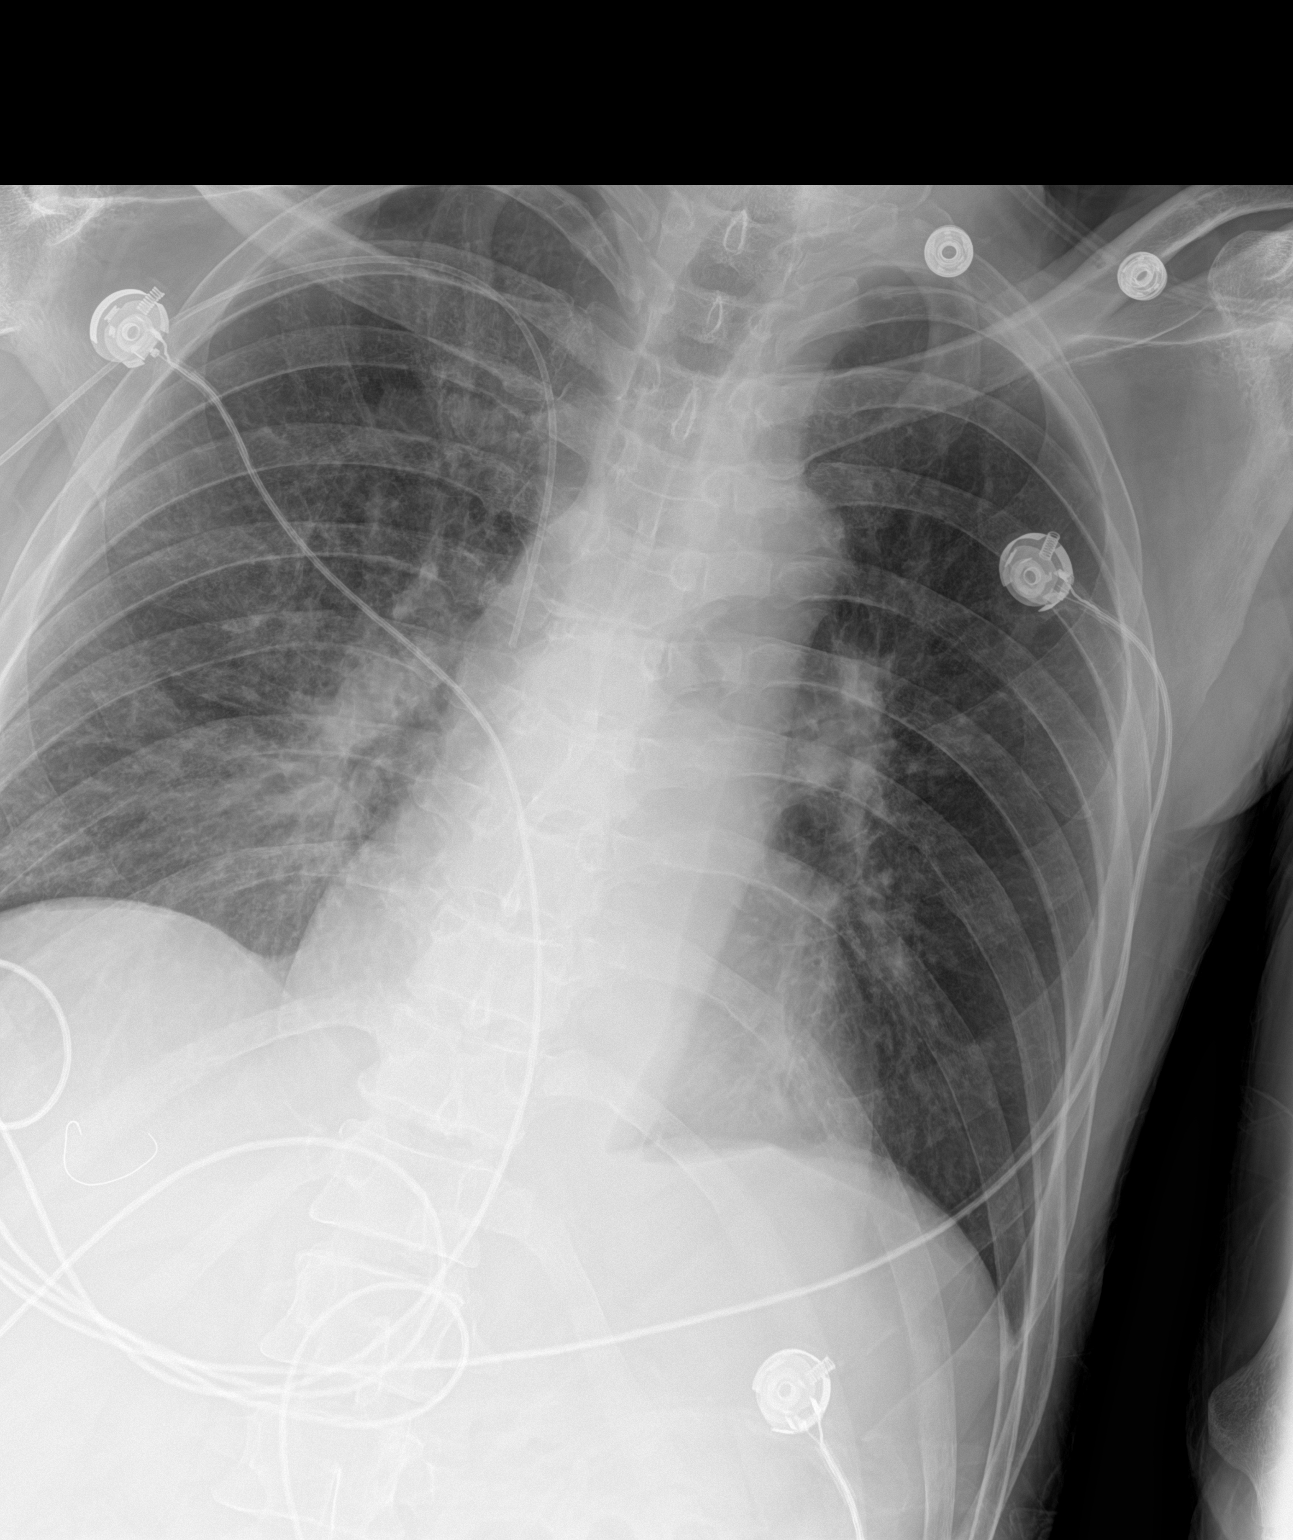

[2 of 2 positions shown; findings below may reference images not displayed]

FINDINGS: Right PICC line tip overlies the mid SVC. Catheter could be advanced
6.5 cm for tip positioned at the SVC/RA junction, as warranted.
Similar appearance of the bibasilar airspace disease. The
cardiopericardial silhouette is within normal limits for size. The
visualized bony structures of the thorax are intact. Telemetry leads
overlie the chest.
IMPRESSION: Right PICC line tip overlies the mid SVC level.

## 2019-10-03 IMAGING — DX PORTABLE CHEST - 1 VIEW
1 series · 1 of 1 positions shown · non-contrast
Comparison: CT 08/01/2018.  Plain film 07/31/2018.

CLINICAL DATA: Respiratory failure

EXAM:
PORTABLE CHEST 1 VIEW

[chest ap]
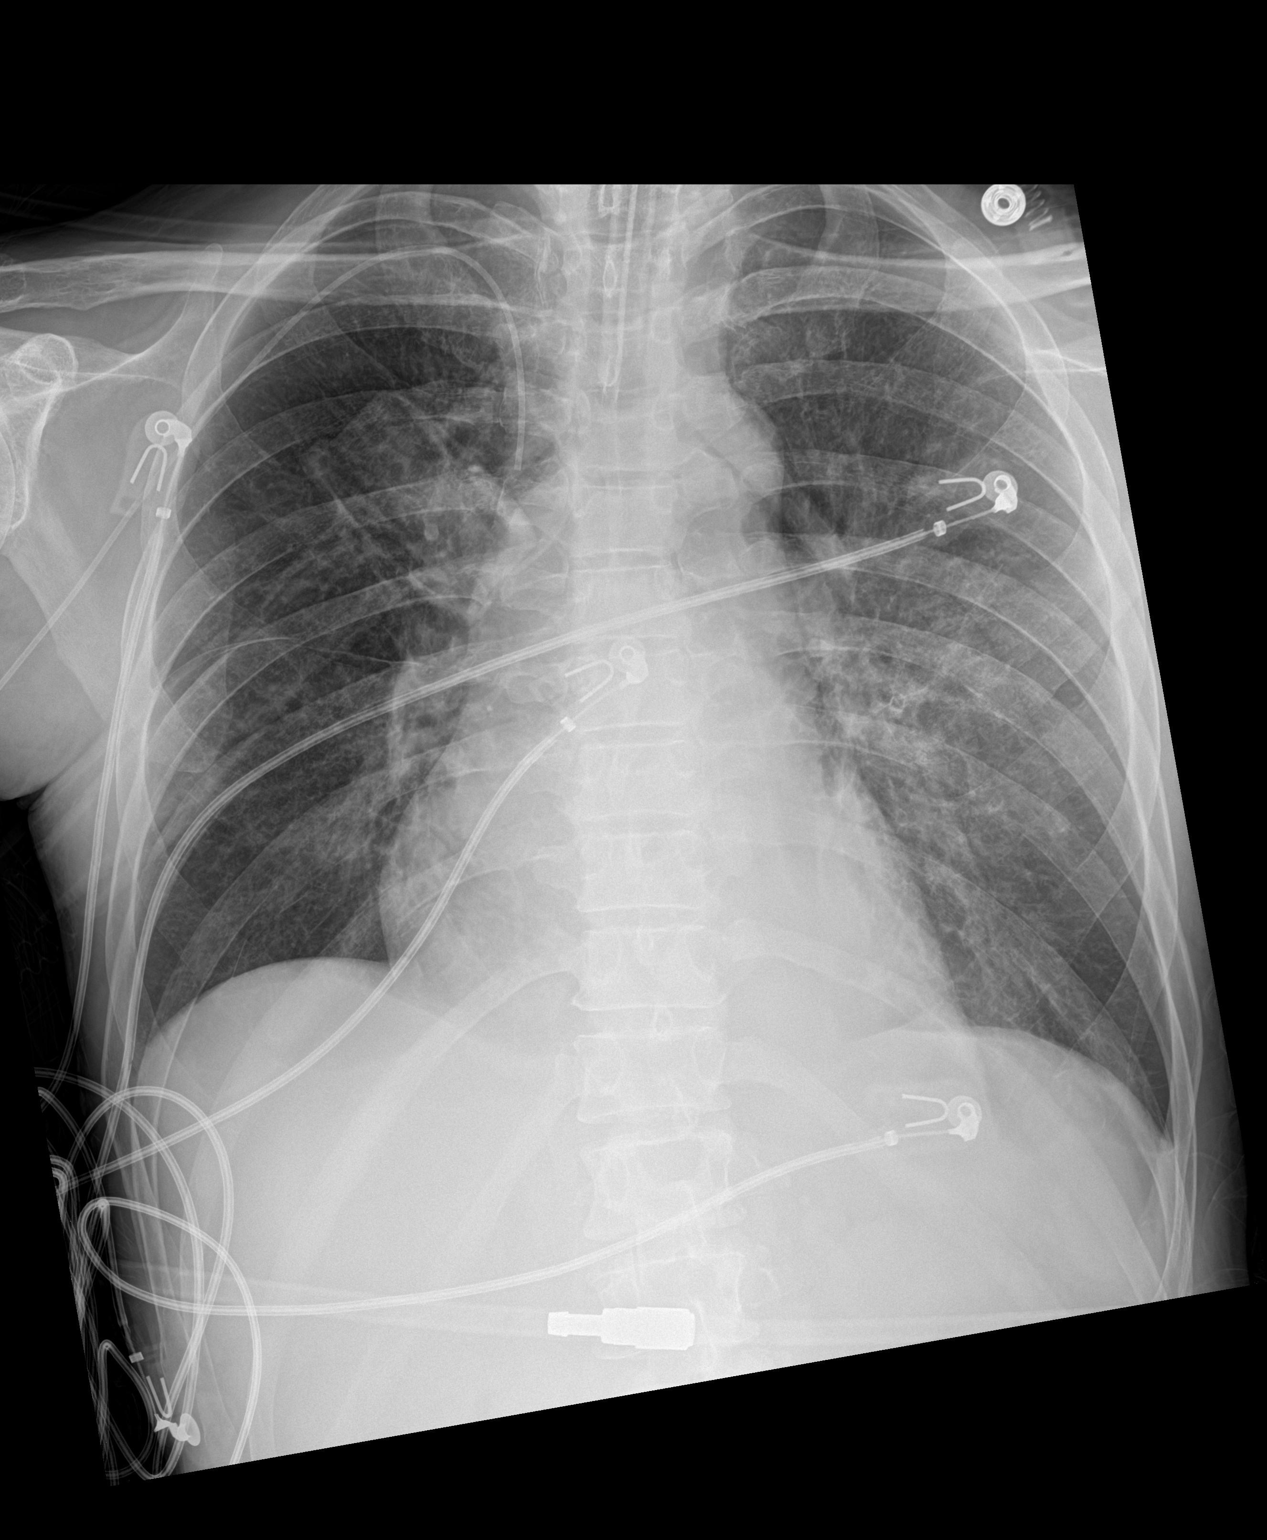

[1 of 1 positions shown; findings below may reference images not displayed]

FINDINGS: Endotracheal tube terminates 4.5 cm above carina. Right-sided PICC
line tip at mid SVC. Normal heart size. No pleural effusion or
pneumothorax. Improved left-sided aeration. Left infrahilar and
medial left lower lobe airspace disease remains. The right lung
remains clear, with artifact projecting over the right suprahilar
region.
IMPRESSION: Improved left-sided airspace disease, likely aspiration or
pneumonia.

## 2020-02-16 DIAGNOSIS — M5412 Radiculopathy, cervical region: Secondary | ICD-10-CM | POA: Insufficient documentation

## 2020-02-16 DIAGNOSIS — R262 Difficulty in walking, not elsewhere classified: Secondary | ICD-10-CM | POA: Insufficient documentation

## 2020-02-16 DIAGNOSIS — E039 Hypothyroidism, unspecified: Secondary | ICD-10-CM | POA: Insufficient documentation

## 2020-02-16 DIAGNOSIS — Z8673 Personal history of transient ischemic attack (TIA), and cerebral infarction without residual deficits: Secondary | ICD-10-CM | POA: Insufficient documentation

## 2020-02-16 DIAGNOSIS — Z87828 Personal history of other (healed) physical injury and trauma: Secondary | ICD-10-CM | POA: Insufficient documentation

## 2020-05-08 DIAGNOSIS — R569 Unspecified convulsions: Secondary | ICD-10-CM

## 2020-05-08 HISTORY — DX: Unspecified convulsions: R56.9

## 2021-05-08 HISTORY — PX: HERNIA REPAIR: SHX51

## 2022-02-02 ENCOUNTER — Encounter: Payer: Self-pay | Admitting: *Deleted

## 2022-02-03 ENCOUNTER — Encounter: Payer: Self-pay | Admitting: Diagnostic Neuroimaging

## 2022-02-03 ENCOUNTER — Ambulatory Visit (INDEPENDENT_AMBULATORY_CARE_PROVIDER_SITE_OTHER): Payer: Medicare Other | Admitting: Diagnostic Neuroimaging

## 2022-02-03 VITALS — BP 125/81 | HR 70 | Ht 70.0 in | Wt 157.0 lb

## 2022-02-03 DIAGNOSIS — G621 Alcoholic polyneuropathy: Secondary | ICD-10-CM

## 2022-02-03 DIAGNOSIS — G6281 Critical illness polyneuropathy: Secondary | ICD-10-CM | POA: Diagnosis not present

## 2022-02-03 DIAGNOSIS — G629 Polyneuropathy, unspecified: Secondary | ICD-10-CM | POA: Diagnosis not present

## 2022-02-03 NOTE — Patient Instructions (Signed)
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

## 2022-02-03 NOTE — Progress Notes (Signed)
[facility]  PATIENT: Mark Patterson DOB: 1965-11-21  REFERRING CLINICIAN: Eber Jones, NP  HISTORY FROM: patient  REASON FOR VISIT: new consult    HISTORICAL  CHIEF COMPLAINT:  Chief Complaint  Patient presents with   New Patient (Initial Visit)    Rm 7 pt alone, pt referred here for worsening neuropathy. He states loss of sensation in bilateral feet/toes. States feels like asleep all the time. Gabapentin 100 mg ordered TID but doesn't always remember to take that often but doesn't really notice much improvement with the medication. He states he had been on higher dose in past but made him to sleepy    HISTORY OF PRESENT ILLNESS:   UPDATE (02/03/22, VRP): Since last visit, doing about the same. Moved to [place] for a while, now back to . On gabapentin 100mg  three times a day, which helps severe shooting pain. Living with roommate.   PRIOR HPI (Oct 8043): 56 year old male here for evaluation of hospital follow-up, pontine myelolysis, encephalopathy, memory loss, gait difficulty, chronic pain.  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  He may drink 3-4 times per day, up to 15 beers per day.  Patient had been to rehabilitation when he was younger.  March 2020 patient was found unresponsive at home.  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  Patient was severely ill, in the intensive care unit for several weeks, and took several months to recover.  Initially he was using a wheelchair and walker.  Now patient able to use a cane.  Patient no longer drinking alcohol.  Now patient living back at home.  He has chronic pain in his neck, low back, numbness and pain in his feet.  History of cervical spine injury when he fell from a horse in 2015 status post surgery.  Also left heel fracture injury after a fall in 2018.    REVIEW OF SYSTEMS: Full 14 system  review of systems performed and negative with exception of: As per HPI.  ALLERGIES: No Known Allergies  HOME MEDICATIONS: Outpatient Medications Prior to Visit  Medication Sig Dispense Refill   amitriptyline (ELAVIL) 50 MG tablet Take 50 mg by mouth at bedtime.     atorvastatin (LIPITOR) 10 MG tablet Take 10 mg by mouth daily.     Cholecalciferol (VITAMIN D3) 1.25 MG (50000 UT) CAPS Take 1 capsule by mouth once a week.     dicyclomine (BENTYL) 10 MG capsule Take 10 mg by mouth 2 (two) times daily.     gabapentin (NEURONTIN) 100 MG capsule Take 100 mg by mouth in the morning, at noon, and at bedtime.     meloxicam (MOBIC) 15 MG tablet Take 1 tablet by mouth daily as needed.     omeprazole (PRILOSEC) 20 MG capsule Take 20 mg by mouth daily.     amitriptyline (ELAVIL) 25 MG tablet Take 25 mg by mouth daily.     DULoxetine (CYMBALTA) 30 MG capsule Take 30 mg by mouth 2 (two) times daily.     gabapentin (NEURONTIN) 100 MG capsule Unsure of dose     naproxen (NAPROSYN) 500 MG tablet TAKE 1 TABLET BY MOUTH BID WITH MEALS     No facility-administered medications prior to visit.    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    PAST SURGICAL HISTORY: Past Surgical History:  Procedure Laterality Date   CERVICAL FUSION  2015   "3 pins" C5-7   IR GASTROSTOMY TUBE MOD SED  08/15/2018   IR GASTROSTOMY TUBE REMOVAL  10/28/2018    FAMILY HISTORY: Family History  Problem Relation Age of Onset   Fibromyalgia Mother    Arthritis Mother    Deep vein thrombosis Mother    Heart attack Sister     SOCIAL HISTORY: Social History   Socioeconomic History   Marital status:  Single    Spouse name: Not on file   Number of children: 2   Years of education: Not on file   Highest education level: 11th grade  Occupational History    Comment: filed for disability  Tobacco Use   Smoking status: Every Day    Packs/day: 0.50    Types: Cigarettes   Smokeless tobacco: Never  Substance and Sexual Activity   Alcohol use: Yes    Comment: 02/03/22 occas, 02/26/19 "none since [facility]Arch 2020", hx heavy ETOH per notes   Drug use: Not Currently   Sexual activity: Not on file  Other Topics Concern   Not on file  Social History Narrative   Lives parents, mom bed bound   Social Determinants of Health   Financial Resource Strain: Not on file  Food Insecurity: Not on file  Transportation Needs: Not on file  Physical Activity: Not on file  Stress: Not on file  Social Connections: Not on file  Intimate Partner Violence: Not on file     PHYSICAL EXAM  GENERAL EXAM/CONSTITUTIONAL: Vitals:  Vitals:   02/03/22 1133  BP: 125/81  Pulse: 70  Weight: 157 lb (71.2 kg)  Height: 5\' 10"  (1.778 m)   Body mass index is 22.53 kg/m. Wt Readings from Last 3 Encounters:  02/03/22 157 lb (71.2 kg)  02/26/19 130 lb 3.2 oz (59.1 kg)   Patient is in no distress; well developed, nourished and groomed [place]: Examination of carotid arteries is normal; no carotid bruits Regular rate and rhythm, no murmurs Examination of peripheral vascular system by observation and palpation is normal  EYES: Ophthalmoscopic exam of optic discs and posterior segments is normal; no papilledema or hemorrhages No results found.  MUSCULOSKELETAL: Gait, strength, tone, movements noted in Neurologic exam below  NEUROLOGIC: MENTAL STATUS:      No data to display         awake, alert, oriented to person, place and time recent and remote memory intact normal attention and concentration language fluent, comprehension intact, naming intact fund of  knowledge appropriate  CRANIAL NERVE:  2nd - no papilledema on fundoscopic exam 2nd, 3rd, 4th, 6th - pupils equal and reactive to light, visual fields full to confrontation, extraocular muscles intact, no nystagmus 5th - facial sensation symmetric 7th - facial strength symmetric 8th - hearing intact 9th - palate elevates symmetrically, uvula midline 11th - shoulder shrug symmetric 12th - tongue protrusion midline  MOTOR:  normal bulk and tone BUE PROX 4; DISTAL 3 BLE PROX 3; DISTAL 1-2  SENSORY:  normal and symmetric to light touch; DECR IN FEET  COORDINATION:  finger-nose-finger, fine finger movements SLOW; ATAXIA IN LUE  REFLEXES:  deep tendon reflexes TRACE and symmetric  GAIT/STATION:  UNSTEADY GAIT; MILD BILATERAL FOOT DROP     DIAGNOSTIC DATA (LABS, IMAGING, TESTING) - I reviewed patient records, labs, notes, testing and imaging myself where available.  Lab Results  Component Value Date   WBC 9.6 08/12/2018   HGB 8.7 (L) 08/12/2018   HCT 25.9 (L) 08/12/2018   MCV 100.8 (H) 08/12/2018   PLT 180 08/12/2018      Component Value Date/Time   NA 133 (L) 08/17/2018 0352   K 4.1 08/17/2018 0352   CL 98 08/17/2018 0352   CO2 25 08/17/2018 0352   GLUCOSE 155 (H) 08/17/2018 0352   BUN 13 08/17/2018 0352   CREATININE 0.58 (L) 08/17/2018 0352   CALCIUM 8.8 (L) 08/17/2018 0352   PROT 6.0 (L) 07/27/2018 0034   ALBUMIN 2.4 (L) 07/27/2018 0034   AST 30 07/27/2018 0034   ALT 19 07/27/2018 0034   ALKPHOS 99 07/27/2018 0034   BILITOT 0.3 07/27/2018 0034   GFRNONAA >60 08/17/2018 0352   GFRAA >60 08/17/2018 0352   Lab Results  Component Value Date   TRIG 111 08/20/2018   No results found for: "HGBA1C" No results found for: "VITAMINB12" Lab Results  Component Value Date   TSH 9.677 (H) 07/18/2018    07/26/16 xray cervical spine [I reviewed images myself and agree with interpretation. -VRP]  - prior C4-C6 fusion hardware  07/13/18 MRI brain [I reviewed images  myself and agree with interpretation. -VRP]  1. Abnormal diffusion signal in the pons with shape primarily concerning for osmotic demyelination 2. Age advanced atrophy. 3. Small remote right parietal infarct.    ASSESSMENT AND PLAN  57 y.o. year old male here with:  Dx:  1. Neuropathy   2. Critical illness neuropathy (HCC)   3. 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    , MD 02/03/2022, 11:53 AM Certified in Neurology, Neurophysiology and Neuroimaging  [facility] [address] [place], [place] [phone]

## 2023-01-22 ENCOUNTER — Encounter: Payer: Self-pay | Admitting: Diagnostic Neuroimaging

## 2023-01-22 ENCOUNTER — Ambulatory Visit (INDEPENDENT_AMBULATORY_CARE_PROVIDER_SITE_OTHER): Payer: 59 | Admitting: Diagnostic Neuroimaging

## 2023-01-22 VITALS — BP 126/86 | HR 68 | Ht 70.0 in | Wt 137.0 lb

## 2023-01-22 DIAGNOSIS — G40909 Epilepsy, unspecified, not intractable, without status epilepticus: Secondary | ICD-10-CM | POA: Diagnosis not present

## 2023-01-22 NOTE — Progress Notes (Signed)
[facility]  PATIENT: Mark Patterson DOB: [birth year]  REFERRING CLINICIAN: Lars Mage, NP  HISTORY FROM: patient  REASON FOR VISIT: follow up   HISTORICAL  CHIEF COMPLAINT:  Chief Complaint  Patient presents with   New Problem    Rm 7, here with alone Pt is here for seizures. Pt states he has not had a seizure in 2 yrs. States he thought he was being referred for memory. States he has been having memory lapses. Questions if this can be assessed today. States has difficulty finding words.     HISTORY OF PRESENT ILLNESS:   UPDATE (01/22/23, VRP): Since last visit, here for evaluation of seizure. Had new onset seizure in 2021, then tingling, numbness, LOC. Then saw a neurologist in [place], then started on levetiracetam 500mg  twice a day. Doing well. No further seizures. Patient no longer driving.   UPDATE (02/03/22, VRP): Since last visit, doing about the same. Moved to [place] for a while, now back to [place]. On gabapentin 100mg  three times a day, which helps severe shooting pain. Living with roommate.   PRIOR HPI (Oct 2957): 57 year old male here for evaluation of hospital follow-up, pontine myelolysis, encephalopathy, memory loss, gait difficulty, chronic pain.  Patient has long history of alcohol abuse since age 44 years old.  He may drink 3-4 times per day, up to 15 beers per day.  Patient had been to rehabilitation when he was younger.  March 2020 patient was found unresponsive at home.  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  Patient was severely ill, in the intensive care unit for several weeks, and took several months to recover.  Initially he was using a wheelchair and walker.  Now patient able to use a cane.  Patient no longer drinking alcohol.  Now patient living back at home.  He has chronic pain in his neck, low back, numbness and pain in his feet.   History of cervical spine injury when he fell from a horse in 2015 status post surgery.  Also left heel fracture injury after a fall in 2018.    REVIEW OF SYSTEMS: Full 14 system review of systems performed and negative with exception of: As per HPI.  ALLERGIES: No Known Allergies  HOME MEDICATIONS: Outpatient Medications Prior to Visit  Medication Sig Dispense Refill   atorvastatin (LIPITOR) 10 MG tablet Take 10 mg by mouth daily.     diclofenac (VOLTAREN) 75 MG EC tablet Take 75 mg by mouth 2 (two) times daily.     gabapentin (NEURONTIN) 100 MG capsule Take 100 mg by mouth in the morning, at noon, and at bedtime.     levETIRAcetam (KEPPRA) 500 MG tablet Take 500 mg by mouth 2 (two) times daily.     magnesium oxide (MAG-OX) 400 (240 Mg) MG tablet Take 400 mg by mouth daily.     methocarbamol (ROBAXIN) 500 MG tablet Take 500 mg by mouth 4 (four) times daily.     pantoprazole (PROTONIX) 40 MG tablet Take 40 mg by mouth daily.     potassium chloride (KLOR-CON) 20 MEQ packet Take by mouth 2 (two) times daily.     traMADol (ULTRAM) 50 MG tablet Take by mouth every 6 (six) hours as needed.     amitriptyline (ELAVIL) 50 MG tablet Take 50 mg by mouth at bedtime.     Cholecalciferol (VITAMIN D3) 1.25 MG (50000 UT) CAPS Take 1 capsule by mouth once  a week.     dicyclomine (BENTYL) 10 MG capsule Take 10 mg by mouth 2 (two) times daily.     meloxicam (MOBIC) 15 MG tablet Take 1 tablet by mouth daily as needed.     omeprazole (PRILOSEC) 20 MG capsule Take 20 mg by mouth daily.     No facility-administered medications prior to visit.    PAST MEDICAL HISTORY: Past Medical History:  Diagnosis Date   Acute on chronic respiratory failure with hypoxia (HCC)    Acute tubular necrosis (HCC)    Aspiration pneumonia (HCC)    Bilateral pneumonia    Cerebral atrophy (HCC)    Non-traumatic rhabdomyolysis    Polysubstance abuse (HCC)    Severe sepsis (HCC)    Stroke (cerebrum) (HCC) 2019   right  parietal    PAST SURGICAL HISTORY: Past Surgical History:  Procedure Laterality Date   CERVICAL FUSION  2015   "3 pins" C5-7   IR GASTROSTOMY TUBE MOD SED  08/15/2018   IR GASTROSTOMY TUBE REMOVAL  10/28/2018    FAMILY HISTORY: Family History  Problem Relation Age of Onset   Fibromyalgia Mother    Arthritis Mother    Deep vein thrombosis Mother    Heart attack Sister     SOCIAL HISTORY: Social History   Socioeconomic History   Marital status: Single    Spouse name: Not on file   Number of children: 2   Years of education: Not on file   Highest education level: 11th grade  Occupational History    Comment: filed for disability  Tobacco Use   Smoking status: Every Day    Current packs/day: 0.50    Types: Cigarettes   Smokeless tobacco: Never  Substance and Sexual Activity   Alcohol use: Yes    Comment: 02/03/22 occas, 02/26/19 "none since MArch 2020", hx heavy ETOH per notes   Drug use: Not Currently   Sexual activity: Not on file  Other Topics Concern   Not on file  Social History Narrative   Lives parents, mom bed bound   Social Determinants of Health   Financial Resource Strain: Not on file  Food Insecurity: Not on file  Transportation Needs: Not on file  Physical Activity: Not on file  Stress: Not on file  Social Connections: Unknown (09/20/2021)   [facility]   Social Network    Social Network: Not on file  Intimate Partner Violence: Unknown (08/12/2021)   Received from [facility]   HITS    Physically Hurt: Not on file    Insult or Talk Down To: Not on file    Threaten Physical Harm: Not on file    Scream or Curse: Not on file     PHYSICAL EXAM  GENERAL EXAM/CONSTITUTIONAL: Vitals:  Vitals:   01/22/23 1112  BP: 126/86  Pulse: 68  Weight: 137 lb (62.1 kg)  Height: 5\' 10"  (1.778 m)   Body mass index is 19.66 kg/m. Wt Readings from Last 3 Encounters:  01/22/23 137 lb (62.1 kg)  02/03/22 157  lb (71.2 kg)  02/26/19 130 lb 3.2 oz (59.1 kg)   Patient is in no distress; well developed, nourished and groomed DECR RANGE OF MOTION IN NECK  CARDIOVASCULAR: Examination of carotid arteries is normal; no carotid bruits Regular rate and rhythm, no murmurs Examination of peripheral vascular system by observation and palpation is normal  EYES: Ophthalmoscopic exam of optic discs and posterior segments is normal; no papilledema or hemorrhages  No results found.  MUSCULOSKELETAL: Gait, strength, tone, movements noted in Neurologic exam below  NEUROLOGIC: MENTAL STATUS:      No data to display         awake, alert, oriented to person, place and time recent and remote memory intact normal attention and concentration language fluent, comprehension intact, naming intact fund of knowledge appropriate  CRANIAL NERVE:  2nd - no papilledema on fundoscopic exam 2nd, 3rd, 4th, 6th - pupils equal and reactive to light, visual fields full to confrontation, extraocular muscles intact, no nystagmus 5th - facial sensation symmetric 7th - facial strength symmetric 8th - hearing intact 9th - palate elevates symmetrically, uvula midline 11th - shoulder shrug symmetric 12th - tongue protrusion midline  MOTOR:  normal bulk and tone BUE PROX 4; DISTAL 3-4 BLE PROX 4; DISTAL 4  SENSORY:  normal and symmetric to light touch; DECR IN FEET  COORDINATION:  finger-nose-finger, fine finger movements SLOW; ATAXIA IN LUE  REFLEXES:  deep tendon reflexes TRACE and symmetric  GAIT/STATION:  UNSTEADY GAIT; MILD BILATERAL FOOT DROP; using cane     DIAGNOSTIC DATA (LABS, IMAGING, TESTING) - I reviewed patient records, labs, notes, testing and imaging myself where available.  Lab Results  Component Value Date   WBC 9.6 08/12/2018   HGB 8.7 (L) 08/12/2018   HCT 25.9 (L) 08/12/2018   MCV 100.8 (H) 08/12/2018   PLT 180 08/12/2018      Component Value Date/Time   NA 133 (L) 08/17/2018  0352   K 4.1 08/17/2018 0352   CL 98 08/17/2018 0352   CO2 25 08/17/2018 0352   GLUCOSE 155 (H) 08/17/2018 0352   BUN 13 08/17/2018 0352   CREATININE 0.58 (L) 08/17/2018 0352   CALCIUM 8.8 (L) 08/17/2018 0352   PROT 6.0 (L) 07/27/2018 0034   ALBUMIN 2.4 (L) 07/27/2018 0034   AST 30 07/27/2018 0034   ALT 19 07/27/2018 0034   ALKPHOS 99 07/27/2018 0034   BILITOT 0.3 07/27/2018 0034   GFRNONAA >60 08/17/2018 0352   GFRAA >60 08/17/2018 0352   Lab Results  Component Value Date   TRIG 111 08/20/2018   No results found for: "HGBA1C" No results found for: "VITAMINB12" Lab Results  Component Value Date   TSH 9.677 (H) 07/18/2018    07/26/16 xray cervical spine [I reviewed images myself and agree with interpretation. -VRP]  - prior C4-C6 fusion hardware  07/13/18 MRI brain [I reviewed images myself and agree with interpretation. -VRP]  1. Abnormal diffusion signal in the pons with shape primarily concerning for osmotic demyelination 2. Age advanced atrophy. 3. Small remote right parietal infarct.    ASSESSMENT AND PLAN  57 y.o. year old male here with:  Dx:  1. 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    Suanne Marker, MD 01/22/2023, 12:00 PM Certified in Neurology, Neurophysiology and Neuroimaging  [facility] [address], [address] [place], [place] [address] [phone]

## 2023-01-22 NOTE — Patient Instructions (Addendum)
  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

## 2023-03-05 ENCOUNTER — Encounter: Payer: Self-pay | Admitting: Gastroenterology

## 2023-03-09 ENCOUNTER — Telehealth: Payer: Self-pay | Admitting: *Deleted

## 2023-03-09 ENCOUNTER — Ambulatory Visit (AMBULATORY_SURGERY_CENTER): Payer: 59 | Admitting: *Deleted

## 2023-03-09 VITALS — Ht 70.0 in | Wt 142.0 lb

## 2023-03-09 DIAGNOSIS — Z1211 Encounter for screening for malignant neoplasm of colon: Secondary | ICD-10-CM

## 2023-03-09 MED ORDER — NA SULFATE-K SULFATE-MG SULF 17.5-3.13-1.6 GM/177ML PO SOLN: 1.0000 | Freq: Once | ORAL | 0 refills | Status: AC

## 2023-03-09 NOTE — Telephone Encounter (Signed)
Called pt to do PV. Pt stated when asked that they are Mark Patterson but when RN asked for 2 identifiers the phone went dead. Pt called back to do PV

## 2023-03-09 NOTE — Progress Notes (Signed)
 Pt's name and DOB verified at the beginning of the pre-visit wit 2 identifiers  Pt denies any difficulty with ambulating,sitting, laying down or rolling side to side  Gave both [facility] main # and MD on call # prior to instructions.   No egg or soy allergy known to patient   No issues known to pt with past sedation with any surgeries or procedures  Pt denies having issues being intubated  Patient denies ever being intubated  Pt has no issues moving head neck or swallowing  No FH of Malignant Hyperthermia  Pt is not on diet pills or shots  Pt is not on home 02   Pt is not on blood thinners   Pt denies issues with constipation   Pt is not on dialysis  Pt denise any abnormal heart rhythms   Pt denies any upcoming cardiac testing  Pt encouraged to use to use Singlecare or Goodrx to reduce cost   Patient's chart reviewed by Cathlyn Parsons CNRA prior to pre-visit and patient appropriate for the [facility].  Pre-visit completed and red dot placed by patient's name on their procedure day (on provider's schedule).  .  Visit by phone  Pt states weight is 142 lb  Instructed pt why it is important to and  to call if they have any changes in health or new medications. Directed them to the # given and on instructions.     Instructions reviewed with pt and pt states understanding. Instructed to review again prior to procedure. Pt states they will.   Instructions sent by mail with coupon and by my chart

## 2023-03-21 ENCOUNTER — Encounter: Payer: 59 | Admitting: Gastroenterology

## 2023-05-14 ENCOUNTER — Encounter: Payer: Self-pay | Admitting: Gastroenterology

## 2023-06-04 ENCOUNTER — Ambulatory Visit (AMBULATORY_SURGERY_CENTER): Payer: 59

## 2023-06-04 VITALS — Ht 70.0 in | Wt 142.0 lb

## 2023-06-04 DIAGNOSIS — Z1211 Encounter for screening for malignant neoplasm of colon: Secondary | ICD-10-CM

## 2023-06-04 MED ORDER — SUFLAVE 178.7 G PO SOLR: 1.0000 | Freq: Once | ORAL | 0 refills | Status: AC

## 2023-06-04 NOTE — Progress Notes (Signed)
No egg or soy allergy known to patient  No issues known to pt with past sedation with any surgeries or procedures Patient denies ever being told they had issues or difficulty with intubation  No FH of Malignant Hyperthermia Pt is not on diet pills Patient states he is not taking GLP medications Pt is not on home 02  Pt is not on blood thinners  Pt denies issues with chronic constipation  No A fib or A flutter Have any cardiac testing pending--no Ambulates independently Patient's chart reviewed by Cathlyn Parsons CNRA prior to previsit and patient appropriate for the [facility].  Previsit completed and red dot placed by patient's name on their procedure day (on provider's schedule).

## 2023-06-05 ENCOUNTER — Telehealth: Payer: Self-pay | Admitting: Gastroenterology

## 2023-06-05 NOTE — Telephone Encounter (Signed)
Spoke with patient about prescription.  He indicated that he had to wait 40 min to speak with a rep from [employer] but the rep ran his insurance and there will be no payment for his prep.  His prep will be delivered 06/12/23.

## 2023-06-05 NOTE — Telephone Encounter (Signed)
Called patient to speak about prep options.  No answer and unable to leave a message, the voice mail is full.

## 2023-06-05 NOTE — Telephone Encounter (Signed)
Inbound call from patient stating that [employer] is wanting him to pay 50 dollars for his prep and he has a zero dollar co pay. Patient is requesting prep be sent to [facility] in [place] if he is expected to pay 50 dollars for the prep. Please advise.

## 2023-06-18 ENCOUNTER — Encounter: Payer: Self-pay | Admitting: Gastroenterology

## 2023-06-18 ENCOUNTER — Ambulatory Visit (AMBULATORY_SURGERY_CENTER): Payer: 59 | Admitting: Gastroenterology

## 2023-06-18 VITALS — BP 97/68 | HR 65 | Temp 97.3°F | Resp 17 | Ht 70.0 in | Wt 142.0 lb

## 2023-06-18 DIAGNOSIS — Z1211 Encounter for screening for malignant neoplasm of colon: Secondary | ICD-10-CM | POA: Diagnosis not present

## 2023-06-18 DIAGNOSIS — K5730 Diverticulosis of large intestine without perforation or abscess without bleeding: Secondary | ICD-10-CM | POA: Diagnosis not present

## 2023-06-18 DIAGNOSIS — K648 Other hemorrhoids: Secondary | ICD-10-CM | POA: Diagnosis not present

## 2023-06-18 DIAGNOSIS — D125 Benign neoplasm of sigmoid colon: Secondary | ICD-10-CM

## 2023-06-18 MED ORDER — SODIUM CHLORIDE 0.9 % IV SOLN: 500.0000 mL | Freq: Once | INTRAVENOUS | Status: DC

## 2023-06-18 NOTE — Patient Instructions (Signed)
Handouts given on polyps and diverticulosis.    YOU HAD AN ENDOSCOPIC PROCEDURE TODAY AT [facility]:   Refer to the procedure report that was given to you for any specific questions about what was found during the examination.  If the procedure report does not answer your questions, please call your gastroenterologist to clarify.  If you requested that your care partner not be given the details of your procedure findings, then the procedure report has been included in a sealed envelope for you to review at your convenience later.  YOU SHOULD EXPECT: Some feelings of bloating in the abdomen. Passage of more gas than usual.  Walking can help get rid of the air that was put into your GI tract during the procedure and reduce the bloating. If you had a lower endoscopy (such as a colonoscopy or flexible sigmoidoscopy) you may notice spotting of blood in your stool or on the toilet paper. If you underwent a bowel prep for your procedure, you may not have a normal bowel movement for a few days.  Please Note:  You might notice some irritation and congestion in your nose or some drainage.  This is from the oxygen used during your procedure.  There is no need for concern and it should clear up in a day or so.  SYMPTOMS TO REPORT IMMEDIATELY:  Following lower endoscopy (colonoscopy or flexible sigmoidoscopy):  Excessive amounts of blood in the stool  Significant tenderness or worsening of abdominal pains  Swelling of the abdomen that is new, acute  Fever of 100F or higher   For urgent or emergent issues, a gastroenterologist can be reached at any hour by calling [phone]. Do not use MyChart messaging for urgent concerns.    DIET:  We do recommend a small meal at first, but then you may proceed to your regular diet.  Drink plenty of fluids but you should avoid alcoholic beverages for 24 hours.  ACTIVITY:  You should plan to take it easy for the rest of today and you should NOT  DRIVE or use heavy machinery until tomorrow (because of the sedation medicines used during the test).    FOLLOW UP: Our staff will call the number listed on your records the next business day following your procedure.  We will call around 7:15- 8:00 am to check on you and address any questions or concerns that you may have regarding the information given to you following your procedure. If we do not reach you, we will leave a message.     If any biopsies were taken you will be contacted by phone or by letter within the next 1-3 weeks.  Please call us at [phone] if you have not heard about the biopsies in 3 weeks.    SIGNATURES/CONFIDENTIALITY: You and/or your care partner have signed paperwork which will be entered into your electronic medical record.  These signatures attest to the fact that that the information above on your After Visit Summary has been reviewed and is understood.  Full responsibility of the confidentiality of this discharge information lies with you and/or your care-partner.

## 2023-06-18 NOTE — Progress Notes (Signed)
 Called to room to assist during endoscopic procedure.  Patient ID and intended procedure confirmed with present staff. Received instructions for my participation in the procedure from the performing physician.

## 2023-06-18 NOTE — Progress Notes (Signed)
To pacu, VSS. Report to Rn,tb

## 2023-06-18 NOTE — Progress Notes (Signed)
 Pt's states no medical or surgical changes since previsit or office visit.

## 2023-06-18 NOTE — Progress Notes (Signed)
 History and Physical:  This patient presents for endoscopic testing for: Encounter Diagnosis  Name Primary?   Special screening for malignant neoplasms, colon Yes    Average risk for colorectal cancer.  1st screening exam.   Patient denies chronic abdominal pain, rectal bleeding, constipation or diarrhea.  Patient is otherwise without complaints or active issues today.   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     levETIRAcetam (KEPPRA) 500 MG tablet Take 500 mg by mouth 2 (two) times daily.     methocarbamol (ROBAXIN) 500 MG tablet Take 500 mg by mouth 3 (three) times daily.     tadalafil (CIALIS) 20 MG tablet Take 20 mg by mouth daily as needed for erectile dysfunction.     traMADol (ULTRAM) 50 MG tablet Take by mouth every 6 (six) hours as needed.     diclofenac (VOLTAREN) 75 MG EC tablet Take 75 mg by mouth 2 (two) times daily.     gabapentin (NEURONTIN) 100 MG capsule Take 100 mg by mouth daily as needed.     pantoprazole (PROTONIX) 40 MG tablet Take 40 mg by mouth daily.     potassium chloride (KLOR-CON) 20 MEQ packet Take by mouth 2 (two)  times daily.     tamsulosin (FLOMAX) 0.4 MG CAPS capsule TAKE 1 CAPSULE BY MOUTH ONCE DAILY AFTER EVENING MEAL FOR BPH (Patient not taking: Reported on 06/18/2023)     Current Facility-Administered Medications  Medication Dose Route Frequency Provider Last Rate Last Admin   0.9 %  sodium chloride  infusion  500 mL Intravenous Once Danis, Temeka Pore L III, MD          ___________________________________________________________________ Objective   Exam:  BP 127/73   Pulse 83   Temp (!) 97.3 F (36.3 C) (Temporal)   Ht 5\' 10"  (1.778 m)   Wt 142 lb (64.4 kg)   SpO2 100%   BMI 20.37 kg/m   CV: regular , S1/S2 Resp: clear to auscultation bilaterally, normal RR and effort noted GI: soft, no tenderness, with active bowel sounds.   Assessment: Encounter Diagnosis  Name Primary?   Special screening for malignant neoplasms, colon Yes     Plan: Colonoscopy   The benefits and risks of the planned procedure were described in detail with the patient or (when appropriate) their health care proxy.  Risks were outlined as including, but not limited to, bleeding, infection, perforation, adverse medication reaction leading to cardiac or pulmonary decompensation, pancreatitis (if ERCP).  The limitation of incomplete mucosal visualization was also discussed.  No  guarantees or warranties were given.  The patient is appropriate for an endoscopic procedure in the ambulatory setting.   - Lorella Roles, MD

## 2023-06-18 NOTE — Op Note (Signed)
 [facility] Patient Name: Mark Patterson Procedure Date: 06/18/2023 8:30 AM MRN: [id number] Endoscopist: Ace Abu L. Dominic Friendly , MD, [id number] Age: 58 Referring MD:  Date of Birth: 1965/05/20 Gender: Male Account #: [id number] Procedure:                Colonoscopy Indications:              Screening for colorectal malignant neoplasm, This                            is the patient's first colonoscopy Medicines:                Monitored Anesthesia Care Procedure:                Pre-Anesthesia Assessment:                           - Prior to the procedure, a History and Physical                            was performed, and patient medications and                            allergies were reviewed. The patient's tolerance of                            previous anesthesia was also reviewed. The risks                            and benefits of the procedure and the sedation                            options and risks were discussed with the patient.                            All questions were answered, and informed consent                            was obtained. Prior Anticoagulants: The patient has                            taken no anticoagulant or antiplatelet agents. ASA                            Grade Assessment: III - A patient with severe                            systemic disease. After reviewing the risks and                            benefits, the patient was deemed in satisfactory                            condition to undergo the procedure.  After obtaining informed consent, the colonoscope                            was passed under direct vision. Throughout the                            procedure, the patient's blood pressure, pulse, and                            oxygen saturations were monitored continuously. The                            Olympus Scope SN [id number] was introduced through the                            anus and advanced  to the the cecum, identified by                            appendiceal orifice and ileocecal valve. The                            colonoscopy was performed without difficulty. The                            patient tolerated the procedure well. The quality                            of the bowel preparation was excellent. The                            ileocecal valve, appendiceal orifice, and rectum                            were photographed. Scope In: 8:41:54 AM Scope Out: 8:57:43 AM Scope Withdrawal Time: 0 hours 10 minutes 40 seconds  Total Procedure Duration: 0 hours 15 minutes 49 seconds  Findings:                 The perianal and digital rectal examinations were                            normal.                           Repeat examination of right colon under NBI                            performed.                           Multiple diverticula were found in the left colon                            and right colon.  A diminutive polyp was found in the sigmoid colon.                            The polyp was sessile. The polyp was removed with a                            cold snare. Resection and retrieval were complete.                           Internal hemorrhoids were found. The hemorrhoids                            were small.                           The exam was otherwise without abnormality on                            direct and retroflexion views. Complications:            No immediate complications. Estimated Blood Loss:     Estimated blood loss was minimal. Impression:               - Diverticulosis in the left colon and in the right                            colon.                           - One diminutive polyp in the sigmoid colon,                            removed with a cold snare. Resected and retrieved.                           - Internal hemorrhoids.                           - The examination was otherwise normal on  direct                            and retroflexion views. Recommendation:           - Patient has a contact number available for                            emergencies. The signs and symptoms of potential                            delayed complications were discussed with the                            patient. Return to normal activities tomorrow.                            Written discharge instructions were provided to  the                            patient.                           - Resume previous diet.                           - Continue present medications.                           - Await pathology results.                           - Repeat colonoscopy is recommended for                            surveillance. The colonoscopy date will be                            determined after pathology results from today's                            exam become available for review. Marigrace Mccole L. Dominic Friendly, MD 06/18/2023 9:01:32 AM This report has been signed electronically.

## 2023-06-19 ENCOUNTER — Telehealth: Payer: Self-pay | Admitting: *Deleted

## 2023-06-19 NOTE — Telephone Encounter (Signed)
1st attempt for call back pt could not hear me and asked that I call right back.Called back and received mailbox full message .

## 2023-06-21 LAB — SURGICAL PATHOLOGY

## 2023-06-22 ENCOUNTER — Encounter: Payer: Self-pay | Admitting: Gastroenterology
